# Patient Record
Sex: Male | Born: 1945
Health system: Southern US, Community
[De-identification: ages and names within clinical notes are randomized; demographics above are authoritative.]

## PROBLEM LIST (undated history)

## (undated) DIAGNOSIS — M1990 Unspecified osteoarthritis, unspecified site: Secondary | ICD-10-CM

## (undated) DIAGNOSIS — E7800 Pure hypercholesterolemia, unspecified: Secondary | ICD-10-CM

## (undated) DIAGNOSIS — E785 Hyperlipidemia, unspecified: Secondary | ICD-10-CM

## (undated) DIAGNOSIS — J309 Allergic rhinitis, unspecified: Secondary | ICD-10-CM

## (undated) DIAGNOSIS — Z8719 Personal history of other diseases of the digestive system: Secondary | ICD-10-CM

## (undated) DIAGNOSIS — M545 Low back pain, unspecified: Secondary | ICD-10-CM

## (undated) DIAGNOSIS — R972 Elevated prostate specific antigen [PSA]: Secondary | ICD-10-CM

## (undated) DIAGNOSIS — K219 Gastro-esophageal reflux disease without esophagitis: Secondary | ICD-10-CM

## (undated) DIAGNOSIS — G8929 Other chronic pain: Secondary | ICD-10-CM

## (undated) DIAGNOSIS — B029 Zoster without complications: Secondary | ICD-10-CM

## (undated) DIAGNOSIS — C4490 Unspecified malignant neoplasm of skin, unspecified: Secondary | ICD-10-CM

## (undated) DIAGNOSIS — K222 Esophageal obstruction: Secondary | ICD-10-CM

## (undated) DIAGNOSIS — R7301 Impaired fasting glucose: Secondary | ICD-10-CM

## (undated) DIAGNOSIS — I2510 Atherosclerotic heart disease of native coronary artery without angina pectoris: Secondary | ICD-10-CM

## (undated) DIAGNOSIS — G4730 Sleep apnea, unspecified: Secondary | ICD-10-CM

## (undated) DIAGNOSIS — K644 Residual hemorrhoidal skin tags: Secondary | ICD-10-CM

## (undated) DIAGNOSIS — I10 Essential (primary) hypertension: Secondary | ICD-10-CM

## (undated) HISTORY — DX: Atherosclerotic heart disease of native coronary artery without angina pectoris: I25.10

## (undated) HISTORY — DX: Low back pain: M54.5

## (undated) HISTORY — DX: Personal history of other diseases of the digestive system: Z87.19

## (undated) HISTORY — DX: Impaired fasting glucose: R73.01

## (undated) HISTORY — PX: OTHER SURGICAL HISTORY: SHX169

## (undated) HISTORY — DX: Residual hemorrhoidal skin tags: K64.4

## (undated) HISTORY — DX: Esophageal obstruction: K22.2

## (undated) HISTORY — PX: APPENDECTOMY: SHX54

## (undated) HISTORY — DX: Essential (primary) hypertension: I10

## (undated) HISTORY — DX: Hyperlipidemia, unspecified: E78.5

## (undated) HISTORY — DX: Low back pain, unspecified: M54.50

## (undated) HISTORY — DX: Low back pain, unspecified: G89.29

## (undated) HISTORY — DX: Zoster without complications: B02.9

## (undated) HISTORY — DX: Allergic rhinitis, unspecified: J30.9

## (undated) HISTORY — DX: Gastro-esophageal reflux disease without esophagitis: K21.9

## (undated) HISTORY — PX: TONSILLECTOMY: SUR1361

---

## 2007-01-24 ENCOUNTER — Emergency Department (HOSPITAL_COMMUNITY): Admission: EM | Admit: 2007-01-24 | Discharge: 2007-01-24 | Payer: Self-pay | Admitting: Emergency Medicine

## 2008-03-27 HISTORY — PX: CORONARY ANGIOPLASTY WITH STENT PLACEMENT: SHX49

## 2008-10-07 ENCOUNTER — Telehealth (INDEPENDENT_AMBULATORY_CARE_PROVIDER_SITE_OTHER): Payer: Self-pay

## 2008-10-08 ENCOUNTER — Ambulatory Visit: Payer: Self-pay | Admitting: Cardiology

## 2008-10-08 ENCOUNTER — Ambulatory Visit: Payer: Self-pay

## 2008-10-08 DIAGNOSIS — E785 Hyperlipidemia, unspecified: Secondary | ICD-10-CM | POA: Insufficient documentation

## 2008-10-08 DIAGNOSIS — R9439 Abnormal result of other cardiovascular function study: Secondary | ICD-10-CM

## 2008-10-08 DIAGNOSIS — I10 Essential (primary) hypertension: Secondary | ICD-10-CM | POA: Insufficient documentation

## 2008-10-08 DIAGNOSIS — K644 Residual hemorrhoidal skin tags: Secondary | ICD-10-CM | POA: Insufficient documentation

## 2008-10-09 ENCOUNTER — Ambulatory Visit: Payer: Self-pay | Admitting: Cardiovascular Disease

## 2008-10-09 ENCOUNTER — Inpatient Hospital Stay (HOSPITAL_COMMUNITY): Admission: RE | Admit: 2008-10-09 | Discharge: 2008-10-10 | Payer: Self-pay | Admitting: Cardiovascular Disease

## 2008-10-12 ENCOUNTER — Encounter: Payer: Self-pay | Admitting: Cardiovascular Disease

## 2008-10-28 DIAGNOSIS — K219 Gastro-esophageal reflux disease without esophagitis: Secondary | ICD-10-CM | POA: Insufficient documentation

## 2008-10-28 DIAGNOSIS — K222 Esophageal obstruction: Secondary | ICD-10-CM

## 2008-11-09 ENCOUNTER — Ambulatory Visit: Payer: Self-pay | Admitting: Cardiovascular Disease

## 2008-11-15 DIAGNOSIS — I2510 Atherosclerotic heart disease of native coronary artery without angina pectoris: Secondary | ICD-10-CM | POA: Insufficient documentation

## 2008-12-04 ENCOUNTER — Ambulatory Visit: Payer: Self-pay | Admitting: Cardiovascular Disease

## 2008-12-08 ENCOUNTER — Telehealth: Payer: Self-pay | Admitting: Cardiovascular Disease

## 2008-12-09 LAB — CONVERTED CEMR LAB
ALT: 19 units/L (ref 0–53)
AST: 24 units/L (ref 0–37)
Alkaline Phosphatase: 83 units/L (ref 39–117)
Bilirubin, Direct: 0.2 mg/dL (ref 0.0–0.3)
HDL: 73.2 mg/dL (ref >39.00)
Total Bilirubin: 1.8 mg/dL — ABNORMAL HIGH (ref 0.3–1.2)
Total Protein: 7.2 g/dL (ref 6.0–8.3)

## 2009-04-27 ENCOUNTER — Ambulatory Visit: Payer: Self-pay | Admitting: Cardiovascular Disease

## 2009-09-20 ENCOUNTER — Encounter: Payer: Self-pay | Admitting: Cardiovascular Disease

## 2009-09-28 ENCOUNTER — Telehealth (INDEPENDENT_AMBULATORY_CARE_PROVIDER_SITE_OTHER): Payer: Self-pay | Admitting: *Deleted

## 2009-09-29 ENCOUNTER — Encounter: Payer: Self-pay | Admitting: Cardiology

## 2009-09-29 ENCOUNTER — Ambulatory Visit: Payer: Self-pay

## 2009-09-29 ENCOUNTER — Ambulatory Visit: Payer: Self-pay | Admitting: Cardiology

## 2009-09-29 ENCOUNTER — Encounter (HOSPITAL_COMMUNITY): Admission: RE | Admit: 2009-09-29 | Discharge: 2009-12-01 | Payer: Self-pay | Admitting: Internal Medicine

## 2009-10-18 ENCOUNTER — Ambulatory Visit: Payer: Self-pay | Admitting: Cardiovascular Disease

## 2009-10-22 ENCOUNTER — Telehealth: Payer: Self-pay | Admitting: Cardiovascular Disease

## 2009-10-25 ENCOUNTER — Telehealth: Payer: Self-pay | Admitting: Cardiovascular Disease

## 2009-11-26 ENCOUNTER — Telehealth: Payer: Self-pay | Admitting: Cardiology

## 2010-01-19 ENCOUNTER — Ambulatory Visit: Payer: Self-pay | Admitting: Cardiovascular Disease

## 2010-01-21 LAB — CONVERTED CEMR LAB
ALT: 19 units/L (ref 0–53)
Albumin: 4 g/dL (ref 3.5–5.2)
Bilirubin, Direct: 0.2 mg/dL (ref 0.0–0.3)
HDL: 67.2 mg/dL (ref >39.00)
Total Bilirubin: 1.5 mg/dL — ABNORMAL HIGH (ref 0.3–1.2)
Total CHOL/HDL Ratio: 3
Total Protein: 6.7 g/dL (ref 6.0–8.3)

## 2010-03-24 ENCOUNTER — Encounter (INDEPENDENT_AMBULATORY_CARE_PROVIDER_SITE_OTHER): Payer: Self-pay | Admitting: *Deleted

## 2010-04-26 NOTE — Progress Notes (Signed)
Summary: Change to Crestor  Phone Note Outgoing Call   Call placed by: Julieta Gutting, RN, BSN,  October 25, 2009 6:46 PM Call placed to: Patient Summary of Call: I spoke with the pt and made him aware that Zetia was not approved by his insurance.  Dr Excell Seltzer recommended either increasing Lipitor to 80mg  daily or stopping Lipitor and switching to Crestor 40mg .  The pt would like to switch to Crestor 40mg  daily.  The pt plans on finishing his current supply of Lipitor and will call our office when he starts Crestor so that his 3 month labwork can be scheduled.  Initial call taken by: Julieta Gutting, RN, BSN,  October 25, 2009 6:46 PM    New/Updated Medications: CRESTOR 40 MG TABS (ROSUVASTATIN CALCIUM) Take one tablet by mouth daily. Prescriptions: CRESTOR 40 MG TABS (ROSUVASTATIN CALCIUM) Take one tablet by mouth daily.  #30 x 4   Entered by:   Julieta Gutting, RN, BSN   Authorized by:   Norva Karvonen, MD   Signed by:   Julieta Gutting, RN, BSN on 10/25/2009   Method used:   Electronically to        [facility] [phone][facility] (retail)       [address]       [place], [place]  [address]       Ph: [phone] or [phone]       Fax: [phone]   RxID:   [id number]

## 2010-04-26 NOTE — Assessment & Plan Note (Signed)
Summary: 6 MONTH   Visit Type:  6 months follow up Primary Provider:  Eric Form, MD  CC:  No complaints.  History of Present Illness: This is a 65 year-old male with CAD s/p stentking of the right coronary artery last year, presenting today for follow-up evaluation. He had atypical symptoms at presentation, with left shoulder pain only at rest. A Myoview scan was ordered and this showed a large area of ischemia. Subsequent cath October 09, 2008 showed critical RCA stenosis treated successfully with a drug-eluting stent.  The pt is doing well at present. He exercises regularly and has no exeritonal symptoms. He denies further left shoulder pain since PCI. He denies chest pain, dyspnea, orthopnea, PND, edema, palpitations, lightheadedness, or syncope.   Current Medications (verified): 1)  Cozaar 100 Mg Tabs (Losartan Potassium) .... One By Mouth Daily 2)  Norvasc 10 Mg Tabs (Amlodipine Besylate) .... Take 1 Tablet By Mouth Once A Day 3)  Lipitor 40 Mg Tabs (Atorvastatin Calcium) .... Take One Tablet By Mouth Daily. 4)  Flonase 50 Mcg/act Susp (Fluticasone Propionate) .... Daily 5)  Protonix 40 Mg Tbec (Pantoprazole Sodium) .... 2 X A Week 6)  Zyrtec Allergy 10 Mg Tabs (Cetirizine Hcl) .... Daily 7)  Aspirin Ec 325 Mg Tbec (Aspirin) .... Take One Tablet By Mouth Daily 8)  Magnesium 250 Mg Tabs (Magnesium) .... One By Mouth Once Daily 9)  Calcium Plus Vitamin D 600-100 Mg-Unit Caps (Calcium Carbonate-Vitamin D) .... One By Mouth Once Daily 10)  Ginkoba 40 Mg Tabs (Ginkgo Biloba) .Marland Kitchen.. 1 By Mouth Once Daily 11)  Zinc Sulfate 100 Mg .Marland Kitchen.. 1 By Mouth Once Daily 12)  Ambien 10 Mg Tabs (Zolpidem Tartrate) .... Prn 13)  Plavix 75 Mg Tabs (Clopidogrel Bisulfate) .... Take 2 Tablets Once A Day 14)  Nitroglycerin 0.4 Mg Subl (Nitroglycerin) .... One Tablet Under Tongue Every 5 Minutes As Needed For Chest Pain---May Repeat Times Three 15)  Fish Oil 1200 Mg Caps (Omega-3 Fatty Acids) .... Take 1 Capsule By  Mouth Two Times A Day  Allergies: 1)  ! Codeine 2)  ! * Bee Stings  Past History:  Past medical history reviewed for relevance to current acute and chronic problems.  Past Medical History: Reviewed history from 10/28/2008 and no changes required. Current Problems:  CAD (ICD-414.00)- post 3.5 x 15 mm XIENCE drug-eluting stent to the distal right coronary artery.  MYOCARDIAL PERFUSION SCAN, WITH STRESS TEST, ABNORMAL (ICD-794.39) ESSENTIAL HYPERTENSION, BENIGN (ICD-401.1) HYPERLIPIDEMIA (ICD-272.4) GERD (ICD-530.81) EXTERNAL HEMORRHOIDS (ICD-455.3) ESOPHAGEAL STRICTURE (ICD-530.3)- STATUS POST DILATATION 10-08-08  Review of Systems       Negative except as per HPI   Vital Signs:  Patient profile:   65 year old male Height:      72 inches Weight:      200.25 pounds BMI:     27.26 Pulse rate:   54 / minute Pulse rhythm:   regular Resp:     18 per minute BP sitting:   110 / 68  (left arm) Cuff size:   large  Vitals Entered By: Vikki Ports (April 27, 2009 1:45 PM)  Physical Exam  General:  Pt is alert and oriented, in no acute distress. HEENT: normal Neck: normal carotid upstrokes without bruits, JVP normal Lungs: CTA CV: RRR without murmur or gallop Abd: soft, NT, positive BS, no bruit, no organomegaly Ext: no clubbing, cyanosis, or edema. peripheral pulses 2+ and equal Skin: warm and dry without rash    EKG  Procedure  date:  04/27/2009  Findings:      NSR with sinus arrhythmia, otherwise within normal limits, HR 64 bpm.  Impression & Recommendations:  Problem # 1:  CAD, NATIVE VESSEL (ICD-414.01) The pt is stable without angina. Advised to reduce ASA dose to 81 mg to lower long-term bleeding risk since he is on plavix as well. Will do a stress Myoview when he is one year out form PCI (July) since he presented with atypical symptoms. If his stress test is ok, I will plan on yearly clinical follow-up without further stress testing unless symptoms  arise.  His updated medication list for this problem includes:    Norvasc 10 Mg Tabs (Amlodipine besylate) .Marland Kitchen... Take 1 tablet by mouth once a day    Aspirin 81 Mg Tbec (Aspirin) .Marland Kitchen... Take one tablet by mouth daily    Plavix 75 Mg Tabs (Clopidogrel bisulfate) .Marland Kitchen... Take 2 tablets once a day    Nitroglycerin 0.4 Mg Subl (Nitroglycerin) ..... One tablet under tongue every 5 minutes as needed for chest pain---may repeat times three  Problem # 2:  HYPERLIPIDEMIA (ICD-272.4) Pt to see Dr Clelia Croft for his annual physical exam in June. He will have follow up lipids checked with his labwork at [facility].  Last lipid panel was excellent - see below.  His updated medication list for this problem includes:    Lipitor 40 Mg Tabs (Atorvastatin calcium) .Marland Kitchen... Take one tablet by mouth daily.  CHOL: 162 (12/04/2008)   LDL: 79 (12/04/2008)   HDL: 73.20 (12/04/2008)   TG: 50.0 (12/04/2008)  Problem # 3:  ESSENTIAL HYPERTENSION, BENIGN (ICD-401.1) Well - controlled. His updated medication list for this problem includes:    Cozaar 100 Mg Tabs (Losartan potassium) ..... One by mouth daily    Norvasc 10 Mg Tabs (Amlodipine besylate) .Marland Kitchen... Take 1 tablet by mouth once a day    Aspirin 81 Mg Tbec (Aspirin) .Marland Kitchen... Take one tablet by mouth daily  BP today: 110/68 Prior BP: 118/72 (11/09/2008)  Labs Reviewed: Chol: 162 (12/04/2008)   HDL: 73.20 (12/04/2008)   LDL: 79 (12/04/2008)   TG: 50.0 (12/04/2008)  Patient Instructions: 1)  Your physician recommends that you schedule a follow-up appointment in: 6 MONTHS 2)  Your physician has recommended you make the following change in your medication: DECREASE ASPIRIN 81MG  ONCE DAILY 3)  Your physician has requested that you have an exercise stress myoview.  For further information please visit [email or web address]  Please follow instruction sheet, as given. 4)  SCHEDULE IN JULY PRIOR TO OFFICE VISIT

## 2010-04-26 NOTE — Progress Notes (Signed)
Summary: change in refill  Phone Note Call from Patient   Reason for Call: Talk to Nurse Summary of Call: pt  having trouble getting insurance to cover med because they have him as wc Crigger and we called it in as Nicholas Hernandez-can we callrefill back in at [facility] [phone] Initial call taken by: Glynda Jaeger,  October 22, 2009 2:39 PM  Follow-up for Phone Call        call pt. back about Rx is not denied because a name. Insurance prefers him to try simvastatin or crestor insterad. Pt. aware.  waiting for Dr. Earmon Phoenix decision. Nicholas Hernandez  October 22, 2009 3:21 PM   Additional Follow-up for Phone Call Additional follow up Details #1::        Recommend discontinue lipitor and trial of crestor 40 mg daily. f/u lipids and lft's 12 weeks with Dr Clelia Croft. Additional Follow-up by: Norva Karvonen, MD,  October 22, 2009 10:23 PM

## 2010-04-26 NOTE — Progress Notes (Signed)
Summary: c/o leg cramp  Phone Note Call from Patient Call back at Home Phone [phone]   Caller: Patient Reason for Call: Talk to Nurse Summary of Call: c/o leg cramps, pt on crestor 40 mg.  Initial call taken by: Lorne Skeens,  November 26, 2009 9:55 AM  Follow-up for Phone Call        Spoke with pt. Patient states he is having cramps on his LE after starting take Crestor 40 mg on 10/25/09. Pt. states the pain is getting worse. According to pt. and wife, Dr. Excell Seltzer recommeded for pt. first, Zetia 10 mg daily and continue taken Lipitor 40 mg daily. Pt's Insurance wanted for pt. to try something else first. Dr. Excell Seltzer stoped Lipitor and stated pt. on Crestor 40 mg daily.  Follow-up by: Ollen Gross, RN, BSN,  November 26, 2009 10:52 AM  Additional Follow-up for Phone Call Additional follow up Details #1::        Dr. Daleen Squibb DOD recommends for pt. to stop Crestor and start Lipitor 40 mg daily ant to have Lipid and liver blood work in 6 to 8 weeks. A prescription send to [facility] pharmacy as requested per pt's wife. Pt has an appointment for blood work on [facility] lab on 01/12/10. Pt and wife aware. Additional Follow-up by: Ollen Gross, RN, BSN,  November 26, 2009 11:16 AM    Additional Follow-up for Phone Call Additional follow up Details #2::     Reviewed Juanito Doom, MD  Follow-up by: Gaylord Shih, MD, [facility],  November 26, 2009 1:27 PM  New/Updated Medications: LIPITOR 40 MG TABS (ATORVASTATIN CALCIUM) take one tablet by mouth daily Prescriptions: LIPITOR 40 MG TABS (ATORVASTATIN CALCIUM) take one tablet by mouth daily  #30 x 6   Entered by:   Ollen Gross, RN, BSN   Authorized by:   Gaylord Shih, MD, [facility]   Signed by:   Ollen Gross, RN, BSN on 11/26/2009   Method used:   [facility]        [facility] [phone]* (retail)       [address]       [place], [place]  [address]       Ph: [phone] or [phone]       Fax: [phone]   RxID:   [id number]

## 2010-04-26 NOTE — Assessment & Plan Note (Signed)
Summary: V7Q   Visit Type:  6 months follow up Primary Provider:  Eric Form, MD  CC:  No complaints.  History of Present Illness: This is a 65 year-old male with CAD s/p stentking of the right coronary artery in 2010, presenting today for follow-up evaluation. He had atypical symptoms at presentation, with left shoulder pain only at rest. A Myoview scan was ordered and this showed a large area of ischemia. Subsequent cath October 09, 2008 showed critical RCA stenosis treated successfully with a drug-eluting stent.  The patient denies recurrent ischemic symptoms. He specifically denies chest pain, dyspnea, edema, lightheadedness, or palpitations.   Current Medications (verified): 1)  Cozaar 100 Mg Tabs (Losartan Potassium) .... One By Mouth Daily 2)  Norvasc 10 Mg Tabs (Amlodipine Besylate) .... Take 1 Tablet By Mouth Once A Day 3)  Lipitor 40 Mg Tabs (Atorvastatin Calcium) .... Take One Tablet By Mouth Daily. 4)  Flonase 50 Mcg/act Susp (Fluticasone Propionate) .... Daily 5)  Protonix 40 Mg Tbec (Pantoprazole Sodium) .... 2 X A Week 6)  Zyrtec Allergy 10 Mg Tabs (Cetirizine Hcl) .... Daily 7)  Aspirin 81 Mg Tbec (Aspirin) .... Take One Tablet By Mouth Daily 8)  Calcium Plus Vitamin D 600-100 Mg-Unit Caps (Calcium Carbonate-Vitamin D) .... One By Mouth Once Daily 9)  Ginkoba 40 Mg Tabs (Ginkgo Biloba) .Marland Kitchen.. 1 By Mouth Once Daily 10)  Zinc Sulfate 100 Mg .Marland Kitchen.. 1 By Mouth Once Daily 11)  Ambien 10 Mg Tabs (Zolpidem Tartrate) .... Prn 12)  Plavix 75 Mg Tabs (Clopidogrel Bisulfate) .... Take 2 Tablets Once A Day 13)  Nitroglycerin 0.4 Mg Subl (Nitroglycerin) .... One Tablet Under Tongue Every 5 Minutes As Needed For Chest Pain---May Repeat Times Three 14)  Fish Oil 1200 Mg Caps (Omega-3 Fatty Acids) .... Take 1 Capsule By Mouth Two Times A Day 15)  Boswellia Serrata Extract  Powd (Boswellia Serrata Extract) .... Two Times A Day  Allergies: 1)  ! Codeine 2)  ! * Bee Stings  Past  History:  Past medical history reviewed for relevance to current acute and chronic problems.  Past Medical History: Current Problems:  CAD (ICD-414.00)- post 3.5 x 15 mm XIENCE drug-eluting stent to the distal right coronary artery.  ESSENTIAL HYPERTENSION, BENIGN (ICD-401.1) HYPERLIPIDEMIA (ICD-272.4) GERD (ICD-530.81) EXTERNAL HEMORRHOIDS (ICD-455.3) ESOPHAGEAL STRICTURE (ICD-530.3)- STATUS POST DILATATION 10-08-08  Review of Systems       Negative except as per HPI   Vital Signs:  Patient profile:   65 year old male Height:      72 inches Weight:      197.50 pounds BMI:     26.88 Pulse rate:   60 / minute Pulse rhythm:   regular Resp:     18 per minute BP sitting:   120 / 76  (left arm) Cuff size:   large  Vitals Entered By: Vikki Ports (October 18, 2009 11:43 AM)  Physical Exam  General:  Pt is alert and oriented, in no acute distress. HEENT: normal Neck: normal carotid upstrokes without bruits, JVP normal Lungs: CTA CV: RRR without murmur or gallop Abd: soft, NT, positive BS, no bruit, no organomegaly Ext: no clubbing, cyanosis, or edema. peripheral pulses 2+ and equal Skin: warm and dry without rash    Nuclear ETT  Procedure date:  09/29/2009  Findings:      Overall Impression   Exercise Capacity: Excellent exercise capacity. BP Response: Hypertensive blood pressure response. Clinical Symptoms: Stopped due to fatigue, no chest pain.  ECG Impression: No significant ST segment change suggestive of ischemia. Overall Impression: Normal stress nuclear study.  Impression & Recommendations:  Problem # 1:  CAD, NATIVE VESSEL (ICD-414.01) Pt stable without angina. He had a nonischemic Myoview study earlier this month with excellent exercise capacity. Recommend continue dual antiplatelet Rx with ASA/Plavix.  His updated medication list for this problem includes:    Norvasc 10 Mg Tabs (Amlodipine besylate) .Marland Kitchen... Take 1 tablet by mouth once a day    Aspirin 81  Mg Tbec (Aspirin) .Marland Kitchen... Take one tablet by mouth daily    Plavix 75 Mg Tabs (Clopidogrel bisulfate) .Marland Kitchen... Take 2 tablets once a day    Nitroglycerin 0.4 Mg Subl (Nitroglycerin) ..... One tablet under tongue every 5 minutes as needed for chest pain---may repeat times three  Problem # 2:  HYPERLIPIDEMIA (ICD-272.4) Lipids reviewed from Dr Alver Fisher office - Chol 178, HDL 72, LDL 91, Trig 77. Will add Zetia to Lipitor to try to achieve LDL less than 70 mg/dL.  His updated medication list for this problem includes:    Lipitor 40 Mg Tabs (Atorvastatin calcium) .Marland Kitchen... Take one tablet by mouth daily.    Zetia 10 Mg Tabs (Ezetimibe) .Marland Kitchen... Take one tablet by mouth daily.  Problem # 3:  ESSENTIAL HYPERTENSION, BENIGN (ICD-401.1)  Controlled.  His updated medication list for this problem includes:    Cozaar 100 Mg Tabs (Losartan potassium) ..... One by mouth daily    Norvasc 10 Mg Tabs (Amlodipine besylate) .Marland Kitchen... Take 1 tablet by mouth once a day    Aspirin 81 Mg Tbec (Aspirin) .Marland Kitchen... Take one tablet by mouth daily  BP today: 120/76 Prior BP: 110/68 (04/27/2009)  Labs Reviewed: Chol: 162 (12/04/2008)   HDL: 73.20 (12/04/2008)   LDL: 79 (12/04/2008)   TG: 50.0 (12/04/2008)  Patient Instructions: 1)  Your physician wants you to follow-up in:  1 year with Dr. Excell Seltzer. You will receive a reminder letter in the mail two months in advance. If you don't receive a letter, please call our office to schedule the follow-up appointment. 2)  Your physician has requested that you have an exercise tolerance test in 1 year with Dr. Excell Seltzer.  For further information please visit [email or web address]  Please also follow instruction sheet, as given. 3)  Start Zetia 10mg  once daily. 4)  You will need a lipid & liver panel at your Primary Care Provider in 3 months.  Prescriptions: ZETIA 10 MG TABS (EZETIMIBE) Take one tablet by mouth daily.  #30 x 6   Entered by:   Sherri Rad, RN, BSN   Authorized by:   Norva Karvonen, MD   Signed by:   Sherri Rad, RN, BSN on 10/18/2009   Method used:   [facility] to        [facility] [phone]* (retail)       [address]       [place], [place]  [address]       Ph: [phone] or [phone]       Fax: [phone]   RxID:   [id number]

## 2010-04-26 NOTE — Assessment & Plan Note (Signed)
Summary: Cardiology Nuclear Study  Nuclear Med Background Indications for Stress Test: Evaluation for Ischemia, Stent Patency   History: COPD, GXT, Heart Catheterization, Myocardial Perfusion Study, Stents  History Comments:  ~5 yrs GXT:OK per patient; '10 ZOX:WRUEA area inferior ischemia, EF=63%>Cath: RCA 90%, N/O LAD/CFX, EF=55%>Stent-RCA  Symptoms: DOE  Symptoms Comments: No cardiac symptoms.   Nuclear Pre-Procedure Cardiac Risk Factors: Family History - CAD, History of Smoking, Hypertension, Lipids Caffeine/Decaff Intake: None NPO After: 9:00 PM Lungs: Clear IV 0.9% NS with Angio Cath: 20g     IV Site: (R) AC IV Started by: Irean Hong RN Chest Size (in) 44     Height (in): 72 Weight (lb): 193 BMI: 26.27  Nuclear Med Study 1 or 2 day study:  1 day     Stress Test Type:  Stress Reading MD:  Marca Ancona, MD     Referring MD:  Tonny Bollman, MD Resting Radionuclide:  Technetium 65m Tetrofosmin     Resting Radionuclide Dose:  11 mCi  Stress Radionuclide:  Technetium 87m Tetrofosmin     Stress Radionuclide Dose:  33 mCi   Stress Protocol Exercise Time (min):  12:01 min     Max HR:  134 bpm     Predicted Max HR:  158 bpm  Max Systolic BP: 231 mm Hg     Percent Max HR:  84.81 %     METS: 13.4 Rate Pressure Product:  54098    Stress Test Technologist:  Rea College CMA-N     Nuclear Technologist:  Domenic Polite CNMT  Rest Procedure  Myocardial perfusion imaging was performed at rest 45 minutes following the intravenous administration of Myoview Technetium 35m Tetrofosmin.  Stress Procedure  The patient exercised for 12:01.  The patient stopped due to fatigue and denied any chest pain.  There were no significant ST-T wave changes; only rare PJC and PAC.  He did have a hypertensive response to exercise with peak BP of 231/79.  Myoview was injected at peak exercise and myocardial perfusion imaging was performed after a brief delay.  QPS Raw Data Images:  Normal; no  motion artifact; normal heart/lung ratio. Stress Images:  NI: Uniform and normal uptake of tracer in all myocardial segments. Rest Images:  Normal homogeneous uptake in all areas of the myocardium. Subtraction (SDS):  There is no evidence of scar or ischemia. Transient Ischemic Dilatation:  .98  (Normal <1.22)  Lung/Heart Ratio:  .37  (Normal <0.45)  Quantitative Gated Spect Images QGS EDV:  105 ml QGS ESV:  40 ml QGS EF:  62 % QGS cine images:  Normal wall motion.    Overall Impression  Exercise Capacity: Excellent exercise capacity. BP Response: Hypertensive blood pressure response. Clinical Symptoms: Stopped due to fatigue, no chest pain.  ECG Impression: No significant ST segment change suggestive of ischemia. Overall Impression: Normal stress nuclear study.       Appended Document: Cardiology Nuclear Study good exercise tolerance and no ischemia noted.  Appended Document: Cardiology Nuclear Study Pt aware of results by phone.

## 2010-04-26 NOTE — Progress Notes (Signed)
Summary: Nuclear Pre-Procedure  Phone Note Outgoing Call Call back at [facility] Phone [phone]   Call placed by: Stanton Kidney, EMT-P,  September 28, 2009 11:15 AM Action Taken: Phone Call Completed Summary of Call: Left message with information on Myoview Information Sheet (see scanned document for details).     Nuclear Med Background Indications for Stress Test: Evaluation for Ischemia, Stent Patency   History: COPD, GXT, Heart Catheterization, Myocardial Perfusion Study, Stents  History Comments:  ~5 yrs GXT:ok per patient 7/10 MPS: EF=63%, large area inferior ischemia > Heart Cath: EF=55%, RCA 90%, N/O LAD/CFX > Stents:RCA  Symptoms: Chest Tightness  Symptoms Comments: No S&S   Nuclear Pre-Procedure Cardiac Risk Factors: Family History - CAD, History of Smoking, Hypertension, Lipids Height (in): 72  Nuclear Med Study Referring MD:  Buren Kos, MD

## 2010-04-28 NOTE — Miscellaneous (Signed)
Summary: update med  Clinical Lists Changes  Medications: Changed medication from PLAVIX 75 MG TABS (CLOPIDOGREL BISULFATE) take 2 tablets once a day to PLAVIX 75 MG TABS (CLOPIDOGREL BISULFATE) Take 1 tablet by mouth once a day

## 2010-07-03 LAB — CBC
HCT: 47 % (ref 39.0–52.0)
HCT: 49.6 % (ref 39.0–52.0)
Hemoglobin: 16.1 g/dL (ref 13.0–17.0)
Hemoglobin: 16.8 g/dL (ref 13.0–17.0)
MCHC: 33.9 g/dL (ref 30.0–36.0)
MCV: 93.8 fL (ref 78.0–100.0)
Platelets: 241 10*3/uL (ref 150–400)
RDW: 13.7 % (ref 11.5–15.5)
RDW: 13.7 % (ref 11.5–15.5)

## 2010-07-03 LAB — BASIC METABOLIC PANEL
Calcium: 9.3 mg/dL (ref 8.4–10.5)
Chloride: 104 mEq/L (ref 96–112)
Chloride: 106 mEq/L (ref 96–112)
GFR calc Af Amer: >60 mL/min (ref >60)
GFR calc Af Amer: >60 mL/min (ref >60)
GFR calc non Af Amer: >60 mL/min (ref >60)
GFR calc non Af Amer: >60 mL/min (ref >60)
Potassium: 4.1 mEq/L (ref 3.5–5.1)
Potassium: 4.6 mEq/L (ref 3.5–5.1)
Sodium: 139 mEq/L (ref 135–145)
Sodium: 141 mEq/L (ref 135–145)

## 2010-07-03 LAB — PROTIME-INR: INR: 1.1 (ref 0.00–1.49)

## 2010-07-03 LAB — APTT: aPTT: 30 seconds (ref 24–37)

## 2010-07-12 ENCOUNTER — Other Ambulatory Visit: Payer: Self-pay | Admitting: Cardiology

## 2010-08-09 NOTE — Cardiovascular Report (Signed)
Nicholas Hernandez, [place] NO.:  [id number]   MEDICAL RECORD NO.:  [id number]          PATIENT TYPE:  INP   LOCATION:  2504                         FACILITY:  [facility]   PHYSICIAN:  Veverly Fells. Excell Seltzer, MD  DATE OF BIRTH:  1945-10-29   DATE OF PROCEDURE:  10/09/2008  DATE OF DISCHARGE:                            CARDIAC CATHETERIZATION   PROCEDURES:  Left heart catheterization, selective coronary angiography,  left ventricular angiography, percutaneous transluminal coronary  angioplasty, stenting of the right coronary artery, and Perclose of the  right femoral artery.   INDICATIONS:  Nicholas Hernandez is a very nice 65 year old gentleman who  presented yesterday after an abnormal Myoview stress test in the office.  The patient has been experiencing intermittent left shoulder pain.  His  shoulder pain has been non-exertional in nature.  In the setting of  multiple risk factors, he was referred for a stress test and this showed  significant inferior ischemia.  A cardiac catheterization was arranged  to evaluate for obstructive coronary disease.   Risks and indications of the procedure were reviewed with the patient.  Informed consent was obtained.  The right groin was prepped, draped, and  anesthetized with 1% lidocaine using modified Seldinger technique.  A 5-  French sheath was placed in the right femoral artery.  Standard 5-French  Judkins catheters were used for coronary angiography and left  ventriculography.  All catheter exchanges were performed over a  guidewire.  The patient tolerated the diagnostic procedure well.  The  diagnostic catheterization demonstrated severe distal right coronary  artery stenosis with an eccentric 90% lesion in that vessel.  I  discussed the findings with the patient and elected to proceed with ad  hoc PCI.  The patient was given an additional 300 mg of Plavix.  The  sheath was upsized to a 6-French.  While we were changing equipment, the  patient  developed marked inferior ST elevation on the monitor.  Angiomax  was quickly started for anticoagulation.  A 6-French JR-4 guide catheter  was inserted.  The patient was given a sublingual nitroglycerin.  After  he received nitroglycerin, his ST elevation resolved.  Angiography  demonstrated a patent right coronary artery.  Once a therapeutic ACT was  achieved, a Cougar guidewire was passed into the distal posterolateral  branch beyond the area of stenosis.  The vessel was then predilated with  a 2.5 x 15-mm Voyager balloon, which was taken to 8 atmospheres.  Following balloon inflation, there was improvement in the degree of  stenosis.  There was TIMI III flow in the vessel.  The balloon was left  in place and an angiogram was taken to assess total length needed for  stent coverage.  A 3.5 x 15-mm XIENCE drug-eluting stent was chosen.  The stent was carefully positioned and then deployed at 12 atmospheres.  The stent appeared well expanded and there was TIMI III flow following  stenting.  The stented segment was then postdilated with a 3.75 x 12-mm  Voyager Leola balloon.  There was an excellent angiographic result with  TIMI III flow at the  completion of the procedure and as well as good  stent expansion.  The guidewire and catheter were removed.  The femoral  arteriotomy was closed with Perclose device.  There were no immediate  complications.   FINDINGS:  Aortic pressure 104/66 with mean of 84, left ventricular  pressure 100 over 11.   Left Mainstem:  The left main is patent.  Bifurcates into the LAD and  left circumflex.  There is a large intermediate branch as well.   LAD:  The LAD is patent to the apex.  There is mild nonobstructive  plaque in the proximal portion of the LAD.  The midportion has a 40-50%  stenosis just before the second diagonal branch.  The LAD has diffuse  plaque, but no areas of high-grade stenosis.   Left Circumflex:  Left circumflex has a 30% ostial lesion.   There is a  moderate size intermediate branch and also has an ostial lesion in the  range of 40-50%.  This appears nonobstructive as well.  There are no  other areas of significant stenosis throughout the left coronary artery.   Right Coronary Artery:  The right coronary artery is tortuous.  It is a  large, dominant vessel.  The proximal and midportions have  nonobstructive plaque.  There is a severe, 90% focal stenosis in the  distal RCA before the bifurcation of the PDA and posterior AV segment.  The posterior AV segment supplies a posterolateral branch without  significant disease.  There are no other areas of high-grade stenosis  present.   Left Ventriculography:  There is an area of basal inferior hypokinesis  with overall preserved LVEF, estimated at 55%.  There is no significant  mitral regurgitation.   ASSESSMENT:  1. Severe distal right coronary artery stenosis with successful      percutaneous intervention using a single drug-eluting stent.  2. Nonobstructive disease as described above in the left anterior      descending (coronary artery) and left circumflex.  3. Mild segmental left ventricular dysfunction with overall preserved      left ventricular ejection fraction.   PLAN:  The patient should remain on dual antiplatelet therapy with  aspirin and Plavix for a minimum of 12 months.  He will continue with  aggressive secondary risk reduction measures.      Veverly Fells. Excell Seltzer, MD  Electronically Signed     MDC/MEDQ  D:  10/09/2008  T:  10/10/2008  Job:  [id number]   cc:   Kari Baars, M.D.  [facility]  Rollene Rotunda, MD, [facility]

## 2010-08-09 NOTE — Discharge Summary (Signed)
NAMEJAKYLE, Nicholas Hernandez NO.:  [id number]   MEDICAL RECORD NO.:  [id number]          PATIENT TYPE:  INP   LOCATION:  2504                         FACILITY:  [facility]   PHYSICIAN:  Marca Ancona, MD      DATE OF BIRTH:  1945/09/20   DATE OF ADMISSION:  10/09/2008  DATE OF DISCHARGE:  10/10/2008                               DISCHARGE SUMMARY   PRIMARY CARDIOLOGIST:  Veverly Fells. Excell Seltzer, MD   PRIMARY CARE PHYSICIAN:  Kari Baars, MD   PROCEDURES PERFORMED DURING HOSPITALIZATION:  1. Cardiac catheterization completed by Dr. Tonny Bollman on October 09, 2008.      a.     Severe distal right coronary artery stenosis with successful       percutaneous intervention using single drug-eluting stent.  This       is a 3.5 x 15 mm XIENCE drug-eluting stent to the distal right       coronary artery, nonobstructive disease in the left anterior       descending artery and left circumflex, mild segmental left       ventricular dysfunction with overall preserved left ventricular       ejection fraction.   FINAL DISCHARGE DIAGNOSES:  1. Coronary artery disease.      a.     Status post abnormal stress Myoview with inferior ischemia.      b.     Status post cardiac catheterization revealing 90% stenosis       in the distal right coronary artery before the bifurcation of the       posterior descending artery and posterior atrioventricular       segment, left circumflex had a 30% ostial lesion.  There was       moderate-sized intermediate branch with an ostial lesion in the       range of 40-50%.  Left anterior descending was patent to the apex,       ejection fraction was 55%.      c.     Status post 3.5 x 15 mm XIENCE drug-eluting stent to the       distal right coronary artery.  2. Hypertension.  3. Hyperlipidemia.  4. Esophageal stricture, status post dilatation.  5. ADAPT-DES study participant.   HOSPITAL COURSE:  This is a 65 year old Caucasian male, who was seen  originally in the office by Dr. Rollene Rotunda after having had a normal  stress Myoview revealing significant inferior ischemia.  The patient was  having intermittent shoulder pain, nonexertional in nature and  therefore, stress test was completed.  After review of stress Myoview,  the patient was scheduled for cardiac catheterization, which was  completed by Dr. Tonny Bollman on October 09, 2008.  Please see copy of  Dr. Earmon Phoenix thorough cardiac catheterization note for more details.  The patient tolerated the procedure well without evidence of bleeding,  hematoma, or signs of infection.  The patient had no recurrence of chest  discomfort or shoulder pain.  The patient was placed on aspirin and  Plavix, and his  Lipitor was increased from 10 mg to 20 mg daily.   On the following morning, the patient was seen and examined by myself  and Dr. Shirlee Latch and found to be stable for discharge.  The patient will  return home and have follow up with Dr. Tonny Bollman in the office  between 2 and 4 weeks.  He has been given post cardiac catheterization  instructions along with new medication prescriptions to include Plavix,  Lipitor, and nitroglycerin.  Thorough discharge instructions were given  to the patient and questions were answered.   DISCHARGE LABORATORIES:  Sodium 139, potassium 4.1, chloride 104, CO2 of  29, BUN 14, creatinine 1.0, and glucose 107.  Hemoglobin 16.8,  hematocrit 49.6, white blood cells 9.3, and platelets 244.  Telemetry  revealing normal sinus rhythm.  EKG dated October 10, 2008, revealing  normal sinus rhythm rate of 60 beats per minute with an incomplete right  bundle-branch block, evidence of septal infarct is noted.   DISCHARGE MEDICATIONS:  1. Plavix 75 mg daily.  2. Enteric-coated aspirin 325 mg daily.  3. Cozaar 100 mg daily.  4. Norvasc 10 mg daily./  5. Zocor 40 mg daily.  6. ADAPT-DES study participant.  7. Prilosec 20 mg daily.  8. Flonase 50 mcg as needed.   9. Fish oil 1000 mg caps daily.  10.Magnesium 250 mg daily.  11.Tramadol 75 mg as needed.  12.Ambien 10 mg at bedtime as needed.  13.Nitroglycerin 0.4 mg sublingual p.r.n.   ALLERGIES:  CODEINE and BEESTINGS.   FOLLOWUP PLANS AND APPOINTMENT:  1. The patient has been given post cardiac catheterization      instructions with particular emphasis on the right groin site for      evidence of bleeding, hematoma, signs of infection along with      activities.  2. The patient has been advised on medications, had been prescriptions      given at discharge.  3. The patient has been advised that our office will call him to make      a followup appointment as this is a weekend.  4. The patient has been advised to follow up with primary care      physician, Dr. Buren Kos for continued medical management.   TIME SPENT WITH THE PATIENT TO INCLUDE PHYSICIAN TIME:  35 minutes.      Bettey Mare. Lyman Bishop, NP      Marca Ancona, MD  Electronically Signed    KML/MEDQ  D:  10/10/2008  T:  10/10/2008  Job:  [id number]   cc:   Kari Baars, M.D.

## 2010-10-03 ENCOUNTER — Other Ambulatory Visit: Payer: Self-pay | Admitting: Cardiovascular Disease

## 2010-10-14 ENCOUNTER — Encounter: Payer: Self-pay | Admitting: Cardiovascular Disease

## 2010-10-18 ENCOUNTER — Ambulatory Visit (INDEPENDENT_AMBULATORY_CARE_PROVIDER_SITE_OTHER): Payer: BC Managed Care – PPO | Admitting: Cardiovascular Disease

## 2010-10-18 ENCOUNTER — Ambulatory Visit: Payer: Self-pay | Admitting: Cardiovascular Disease

## 2010-10-18 DIAGNOSIS — I2510 Atherosclerotic heart disease of native coronary artery without angina pectoris: Secondary | ICD-10-CM

## 2010-10-18 NOTE — Progress Notes (Signed)
Exercise Treadmill Test  Pre-Exercise Testing Evaluation Rhythm: normal sinus  Rate: 69   PR:  .17 QRS:  .09  QT:  .40 QTc: .43     Test  Exercise Tolerance Test Ordering MD: Tonny Bollman, MD  Interpreting MD:  Tonny Bollman, MD  Unique Test No: 1  Treadmill:  1  Indication for ETT: CAD  Contraindication to ETT: No   Stress Modality: exercise - treadmill  Cardiac Imaging Performed: non   Protocol: standard Bruce - maximal  Max BP:  194/81  Max MPHR (bpm):  156 85% MPR (bpm):  132  MPHR obtained (bpm):  150 % MPHR obtained:  95  Reached 85% MPHR (min:sec):  9:30 Total Exercise Time (min-sec):  12:00  Workload in METS:  13.4 Borg Scale: 12  Reason ETT Terminated:  desired heart rate attained    ST Segment Analysis At Rest: normal ST segments - no evidence of significant ST depression With Exercise: no evidence of significant ST depression  Other Information Arrhythmia:  No Angina during ETT:  absent (0) Quality of ETT:  diagnostic  ETT Interpretation:  normal - no evidence of ischemia by ST analysis  Comments: Excellent exercise capacity. Isolated PVC's with exercise, but no significant arrhythmia. No angina or significant ST depression with exertion.  Recommendations: Continue with medical therapy and exercise program.

## 2010-11-09 ENCOUNTER — Encounter: Payer: Self-pay | Admitting: Cardiovascular Disease

## 2010-12-30 ENCOUNTER — Other Ambulatory Visit: Payer: Self-pay | Admitting: Cardiovascular Disease

## 2011-01-04 LAB — DIFFERENTIAL
Basophils Absolute: 0
Basophils Relative: 0
Eosinophils Absolute: 0.3
Eosinophils Relative: 3
Lymphocytes Relative: 19
Lymphs Abs: 1.8
Monocytes Relative: 7
Neutrophils Relative %: 71

## 2011-01-04 LAB — CBC
HCT: 45.9
Hemoglobin: 15.7
MCHC: 34.2
MCV: 92.3
WBC: 9.8

## 2011-01-04 LAB — I-STAT 8, (EC8 V) (CONVERTED LAB)
Chloride: 106
Potassium: 4.7
Sodium: 138
TCO2: 29
pH, Ven: 7.332 — ABNORMAL HIGH

## 2011-01-04 LAB — POCT CARDIAC MARKERS
Myoglobin, poc: 96.4
Operator id: 146091
Operator id: 198171

## 2011-01-04 LAB — POCT I-STAT CREATININE
Creatinine, Ser: 1.2
Operator id: 146091

## 2011-03-22 ENCOUNTER — Other Ambulatory Visit: Payer: Self-pay | Admitting: Adult Health

## 2011-03-22 ENCOUNTER — Other Ambulatory Visit: Payer: Self-pay | Admitting: Cardiovascular Disease

## 2011-03-22 MED ORDER — NITROGLYCERIN 0.4 MG SL SUBL: 0.4000 mg | SUBLINGUAL_TABLET | SUBLINGUAL | Status: DC | PRN

## 2011-04-28 ENCOUNTER — Other Ambulatory Visit: Payer: Self-pay | Admitting: Cardiovascular Disease

## 2011-06-23 ENCOUNTER — Other Ambulatory Visit: Payer: Self-pay | Admitting: Cardiovascular Disease

## 2011-09-25 ENCOUNTER — Other Ambulatory Visit: Payer: Self-pay | Admitting: Cardiovascular Disease

## 2011-11-09 ENCOUNTER — Ambulatory Visit: Payer: BC Managed Care – PPO | Admitting: Cardiovascular Disease

## 2011-11-20 ENCOUNTER — Encounter: Payer: Self-pay | Admitting: Cardiovascular Disease

## 2011-11-20 ENCOUNTER — Ambulatory Visit (INDEPENDENT_AMBULATORY_CARE_PROVIDER_SITE_OTHER): Payer: BC Managed Care – PPO | Admitting: Cardiovascular Disease

## 2011-11-20 VITALS — BP 130/80 | HR 64 | Ht 72.0 in | Wt 200.0 lb

## 2011-11-20 DIAGNOSIS — E785 Hyperlipidemia, unspecified: Secondary | ICD-10-CM

## 2011-11-20 DIAGNOSIS — I2510 Atherosclerotic heart disease of native coronary artery without angina pectoris: Secondary | ICD-10-CM

## 2011-11-20 DIAGNOSIS — I10 Essential (primary) hypertension: Secondary | ICD-10-CM

## 2011-11-20 NOTE — Assessment & Plan Note (Signed)
Dr. Clelia Croft is recommended that he consider a trial of Lipitor and ezetimibe in combination to achieve a goal LDL closer to 70. I agree and I encouraged the patient to give this a trial.

## 2011-11-20 NOTE — Progress Notes (Signed)
HPI:  66 year old gentleman presenting for followup of coronary artery disease. He underwent PCI to right coronary artery in 2010 with a drug-eluting stent platform. He presented with somewhat atypical symptoms but had a markedly abnormal nuclear study with inferior ischemia. He presents today for followup evaluation. He was last seen in January 2012. His last nuclear scan July 2011 showed no ischemia. His quantitative left ventricular ejection fraction was 62%. He brings in lab work today from [facility]. His blood work was drawn 10/31/2011. This shows a glucose of 130, creatinine 1.0, cholesterol 193, triglycerides 78, HDL 78, and LDL 99.  The patient feels well. He is physically active. He walks with his wife every morning. He plays golf and carries his own bag. He has no symptoms of chest pain or pressure. He's had no shoulder pain which was his prior anginal equivalent. He denies orthopnea, PND, palpitations, lightheadedness, or syncope. He does admit to dyspnea with exertion when he really pushes it hard.  Outpatient Encounter Prescriptions as of 11/20/2011  Medication Sig Dispense Refill  . amLODipine (NORVASC) 10 MG tablet Take 10 mg by mouth daily.        Marland Kitchen aspirin 81 MG tablet Take 81 mg by mouth daily.        Jeanie Cooks Serrata Extract POWD by Does not apply route 2 (two) times daily.        . cetirizine (ZYRTEC ALLERGY) 10 MG tablet Take 10 mg by mouth daily.        . Ginkgo Biloba (GINKOBA) 40 MG TABS Take by mouth daily.        Marland Kitchen LIPITOR 40 MG tablet TAKE ONE TABLET BY MOUTH DAILY.  30 tablet  2  . losartan (COZAAR) 100 MG tablet Take 100 mg by mouth daily.        . nitroGLYCERIN (NITROSTAT) 0.4 MG SL tablet Place 1 tablet (0.4 mg total) under the tongue every 5 (five) minutes as needed.  100 tablet  6  . Omega-3 Fatty Acids (FISH OIL) 1200 MG CAPS Take by mouth 2 (two) times daily.        . pantoprazole (PROTONIX) 40 MG tablet TAKE 1 TABLET BY MOUTH ONCE A DAY  30  tablet  5  . PLAVIX 75 MG tablet TAKE 1 TABLET EVERY DAY  90 tablet  5  . ZINC SULFATE PO Take 100 mg by mouth daily.        Marland Kitchen zolpidem (AMBIEN) 10 MG tablet Take 10 mg by mouth as needed.        . fluticasone (FLONASE) 50 MCG/ACT nasal spray HOLD RIGHT NOW      . DISCONTD: Calcium Carb-Cholecalciferol (CALCIUM PLUS VITAMIN D) 600-100 MG-UNIT CAPS Take by mouth daily.          Allergies  Allergen Reactions  . Codeine     Past Medical History  Diagnosis Date  . CAD (coronary artery disease)     post 3.5 x 15mm xience durg-eluting stent to distal right coronary artery  . Essential hypertension, benign   . HLD (hyperlipidemia)   . GERD (gastroesophageal reflux disease)   . External hemorrhoids   . Esophageal stricture     status post dilatation 10-08-08    ROS: Negative except as per HPI  BP 130/80  Pulse 64  Ht 6' (1.829 m)  Wt 200 lb (90.719 kg)  BMI 27.12 kg/m2  PHYSICAL EXAM: Pt is alert and oriented, NAD HEENT: normal Neck: JVP - normal, carotids 2+=  without bruits Lungs: CTA bilaterally CV: RRR without murmur or gallop Abd: soft, NT, Positive BS, no hepatomegaly Ext: no C/C/E, distal pulses intact and equal Skin: warm/dry no rash  EKG:  Sinus bradycardia 56 beats per minute, within normal limits.  ASSESSMENT AND PLAN:

## 2011-11-20 NOTE — Assessment & Plan Note (Signed)
Blood pressure is well-controlled on losartan.  

## 2011-11-20 NOTE — Assessment & Plan Note (Signed)
Stable without anginal symptoms. He had atypical angina at initial presentation. He is 3 years out from PCI and have recommended an exercise treadmill study without imaging. If his treadmill shows no ischemia, I would recommend stopping Plavix at this point. He should continue on antiplatelet therapy with aspirin for the long term.

## 2011-11-20 NOTE — Patient Instructions (Addendum)
Your physician has requested that you have an exercise tolerance test. For further information please visit [email or web address] Please also follow instruction sheet, as given. IF YOUR TREADMILL TEST IS NORMAL YOU CAN STOP PLAVIX PER DR COOPER.   Your physician recommends that you continue on your current medications as directed. Please refer to the Current Medication list given to you today.  Your physician wants you to follow-up in: 1 YEAR with Dr Excell Seltzer.  You will receive a reminder letter in the mail two months in advance. If you don't receive a letter, please call our office to schedule the follow-up appointment.

## 2011-12-20 ENCOUNTER — Ambulatory Visit (INDEPENDENT_AMBULATORY_CARE_PROVIDER_SITE_OTHER): Payer: BC Managed Care – PPO | Admitting: Physician Assistant

## 2011-12-20 DIAGNOSIS — I25810 Atherosclerosis of coronary artery bypass graft(s) without angina pectoris: Secondary | ICD-10-CM

## 2011-12-20 DIAGNOSIS — I10 Essential (primary) hypertension: Secondary | ICD-10-CM

## 2011-12-20 DIAGNOSIS — I2510 Atherosclerotic heart disease of native coronary artery without angina pectoris: Secondary | ICD-10-CM

## 2011-12-20 DIAGNOSIS — E785 Hyperlipidemia, unspecified: Secondary | ICD-10-CM

## 2011-12-20 NOTE — Procedures (Signed)
Exercise Treadmill Test  Pre-Exercise Testing Evaluation Rhythm: normal sinus  Rate: 69   PR:  .16 QRS:  .09  QT:  .42 QTc: .45     Test  Exercise Tolerance Test Ordering MD: Tonny Bollman, MD  Interpreting MD: Tereso Newcomer, PA-C  Unique Test No: 1  Treadmill:  1  Indication for ETT: known ASHD  Contraindication to ETT: No   Stress Modality: exercise - treadmill  Cardiac Imaging Performed: non   Protocol: standard Bruce - maximal  Max BP:  207/87  Max MPHR (bpm):  155 85% MPR (bpm):  132  MPHR obtained (bpm):  151 % MPHR obtained:  97%  Reached 85% MPHR (min:sec):  9:15 Total Exercise Time (min-sec):  12:00  Workload in METS:  13.4 Borg Scale: 13.4  Reason ETT Terminated:  desired heart rate attained    ST Segment Analysis At Rest: normal ST segments - no evidence of significant ST depression With Exercise: no evidence of significant ST depression  Other Information Arrhythmia:  No Angina during ETT:  absent (0) Quality of ETT:  diagnostic  ETT Interpretation:  normal - no evidence of ischemia by ST analysis  Comments: Excellent exercise tolerance. No chest pain. Normal BP response to exercise. No ST-T changes to suggest ischemia.   Recommendations: Results d/w Kaylyn Lim. Follow up with Dr. Tonny Bollman as directed. Tereso Newcomer, PA-C  9:35 AM 12/20/2011

## 2012-06-30 ENCOUNTER — Other Ambulatory Visit: Payer: Self-pay | Admitting: Cardiovascular Disease

## 2012-11-04 ENCOUNTER — Encounter: Payer: Self-pay | Admitting: Cardiovascular Disease

## 2012-11-19 ENCOUNTER — Ambulatory Visit: Payer: BC Managed Care – PPO | Admitting: Cardiovascular Disease

## 2012-12-04 ENCOUNTER — Ambulatory Visit (INDEPENDENT_AMBULATORY_CARE_PROVIDER_SITE_OTHER): Payer: BC Managed Care – PPO | Admitting: Cardiovascular Disease

## 2012-12-04 ENCOUNTER — Encounter: Payer: Self-pay | Admitting: Cardiovascular Disease

## 2012-12-04 VITALS — BP 120/76 | HR 76 | Ht 72.0 in | Wt 197.0 lb

## 2012-12-04 DIAGNOSIS — I2510 Atherosclerotic heart disease of native coronary artery without angina pectoris: Secondary | ICD-10-CM

## 2012-12-04 NOTE — Patient Instructions (Addendum)
Your physician has requested that you have an exercise tolerance test in 1 year .  Please also follow instruction sheet, as given. No return office visit needed Dr Excell Seltzer will talk with you during treadmill.    Your physician recommends that you continue on your current medications as directed. Please refer to the Current Medication list given to you today.

## 2012-12-04 NOTE — Progress Notes (Signed)
HPI:   67 year old gentleman presenting for followup evaluation. He has coronary artery disease and underwent PCI of the right coronary artery in 2010. He has done well without recurrent symptoms since that time. He had a non-imaging exercise treadmill study one year ago where he was able to exercise for 12 minutes according to the Bruce protocol without significant ST changes or anginal symptoms.   The patient is doing very well. He is physically active and does a lot of walking and golfing. He has no symptoms with exertion. He specifically denies chest pain or pressure, dyspnea, or palpitations. He's compliant with his medical program. He tells me his lipids are much better this year after adding ezetimibe to the Lipitor.  Outpatient Encounter Prescriptions as of 12/04/2012  Medication Sig Dispense Refill  . amLODipine (NORVASC) 10 MG tablet Take 10 mg by mouth daily.        Marland Kitchen aspirin 81 MG tablet Take 81 mg by mouth daily.        . cetirizine (ZYRTEC ALLERGY) 10 MG tablet Take 10 mg by mouth daily.        Marland Kitchen ezetimibe (ZETIA) 10 MG tablet Take 10 mg by mouth daily.      . fluticasone (FLONASE) 50 MCG/ACT nasal spray HOLD RIGHT NOW      . Ginkgo Biloba (GINKOBA) 40 MG TABS Take by mouth daily.        Marland Kitchen LIPITOR 40 MG tablet TAKE ONE TABLET BY MOUTH DAILY.  30 tablet  2  . losartan (COZAAR) 100 MG tablet Take 100 mg by mouth daily.        . nitroGLYCERIN (NITROSTAT) 0.4 MG SL tablet Place 1 tablet (0.4 mg total) under the tongue every 5 (five) minutes as needed.  100 tablet  6  . Omega-3 Fatty Acids (FISH OIL) 1200 MG CAPS Take by mouth 2 (two) times daily.        . pantoprazole (PROTONIX) 40 MG tablet       . Psyllium (METAMUCIL PO) Take by mouth daily.      Marland Kitchen ZINC SULFATE PO Take 100 mg by mouth daily.        Marland Kitchen zolpidem (AMBIEN) 10 MG tablet Take 10 mg by mouth as needed.        . [DISCONTINUED] pantoprazole (PROTONIX) 40 MG tablet TAKE 1 TABLET BY MOUTH ONCE A DAY  30 tablet  5  .  [DISCONTINUED] Boswellia Serrata Extract POWD by Does not apply route 2 (two) times daily.        . [DISCONTINUED] PLAVIX 75 MG tablet TAKE 1 TABLET EVERY DAY  90 tablet  5   No facility-administered encounter medications on file as of 12/04/2012.    Allergies  Allergen Reactions  . Bee Venom   . Codeine     Past Medical History  Diagnosis Date  . CAD (coronary artery disease)     post 3.5 x 15mm xience durg-eluting stent to distal right coronary artery  . Essential hypertension, benign   . HLD (hyperlipidemia)   . GERD (gastroesophageal reflux disease)   . External hemorrhoids   . Esophageal stricture     status post dilatation 10-08-08    ROS: Negative except as per HPI  BP 120/76  Pulse 76  Ht 6' (1.829 m)  Wt 197 lb (89.359 kg)  BMI 26.71 kg/m2  SpO2 98%  PHYSICAL EXAM: Pt is alert and oriented, NAD HEENT: normal Neck: JVP - normal, carotids 2+= without bruits Lungs: CTA  bilaterally CV: RRR without murmur or gallop Abd: soft, NT, Positive BS, no hepatomegaly Ext: no C/C/E, distal pulses intact and equal Skin: warm/dry no rash  EKG:  Normal sinus rhythm 76 beats per minute, within normal limits.  ASSESSMENT AND PLAN: 1. Coronary artery disease, native vessel. The patient is stable without anginal symptoms. He will return in 12 months for followup. At that time I am going to have him do a non-imaging treadmill study.  His medications were reviewed and he is on a good medical program.  2. Hyperlipidemia. Followed by Dr. Clelia Croft. On aggressive lipid-lowering with ezetimibe, atorvastatin, and fish oil.  3. Hypertension. Blood pressure is well controlled on combination of amlodipine and losartan.  Tonny Bollman 12/04/2012        2:17 PM

## 2013-01-30 ENCOUNTER — Other Ambulatory Visit: Payer: Self-pay

## 2013-03-10 ENCOUNTER — Other Ambulatory Visit: Payer: Self-pay | Admitting: Cardiovascular Disease

## 2013-12-17 ENCOUNTER — Encounter: Payer: BC Managed Care – PPO | Admitting: Physician Assistant

## 2014-12-22 DIAGNOSIS — G8929 Other chronic pain: Secondary | ICD-10-CM | POA: Insufficient documentation

## 2014-12-22 DIAGNOSIS — B029 Zoster without complications: Secondary | ICD-10-CM | POA: Insufficient documentation

## 2014-12-22 DIAGNOSIS — J309 Allergic rhinitis, unspecified: Secondary | ICD-10-CM | POA: Insufficient documentation

## 2014-12-22 DIAGNOSIS — R7301 Impaired fasting glucose: Secondary | ICD-10-CM | POA: Insufficient documentation

## 2014-12-22 DIAGNOSIS — Z8719 Personal history of other diseases of the digestive system: Secondary | ICD-10-CM | POA: Insufficient documentation

## 2014-12-22 DIAGNOSIS — M545 Low back pain, unspecified: Secondary | ICD-10-CM | POA: Insufficient documentation

## 2014-12-23 NOTE — Progress Notes (Signed)
Cardiology Office Note   Date:  12/24/2014   ID:  Nicholas Hernandez, Nicholas Hernandez [birth year], MRN [id number]  PCP:  Marton Redwood, MD  Cardiologist:  Dr. Sherren Mocha   Electrophysiologist:  n/a  Chief Complaint  Patient presents with  . Coronary Artery Disease     History of Present Illness: Nicholas Hernandez is a 69 y.o. male with a hx of CAD status post PCI with a DES to the RCA in 2010. ETT 9/13 negative for ischemia. Last seen by Dr. Burt Knack 9/14. Plan was for follow-up ETT in 1 year.  Here for FU.  Doing very well. Plays golf (walks), pickle ball and does yard work. The patient denies chest pain, shortness of breath, syncope, orthopnea, PND or significant pedal edema.    Studies/Reports Reviewed Today:  ETT 9/13 ETT Interpretation: normal - no evidence of ischemia by ST analysis  Myoview 7/11 Overall Impression: Normal stress nuclear study.  EF 62%  LHC 7/10 LM:  Patent LAD:  midportion has a 40-50% LCx:  30% ostial, RI ostial 40-50% RCA:  Dist 90% LVEF, estimated at 55%.  PCI:  3.5 x 15-mm XIENCE drug-eluting stent to RCA    Past Medical History  Diagnosis Date  . CAD (coronary artery disease)     post 3.5 x 52mm xience durg-eluting stent to distal right coronary artery  . Essential hypertension, benign   . HLD (hyperlipidemia)   . GERD (gastroesophageal reflux disease)   . External hemorrhoids   . Esophageal stricture     status post dilatation 10-08-08  . Impaired fasting glucose   . Chronic LBP   . Shingles   . Allergic rhinitis   . History of esophageal stricture     Past Surgical History  Procedure Laterality Date  . Appendectomy    . Tonsillectomy    . Right inguinal herniorrhaphy       Current Outpatient Prescriptions  Medication Sig Dispense Refill  . amLODipine (NORVASC) 10 MG tablet Take 10 mg by mouth daily.      Marland Kitchen aspirin 81 MG tablet Take 81 mg by mouth daily.      . cetirizine (ZYRTEC ALLERGY) 10 MG tablet Take 10 mg by mouth daily.      Marland Kitchen ezetimibe  (ZETIA) 10 MG tablet Take 10 mg by mouth daily.    . fluticasone (FLONASE) 50 MCG/ACT nasal spray HOLD RIGHT NOW    . Ginkgo Biloba (GINKOBA) 40 MG TABS Take 40 mg by mouth daily.     Marland Kitchen LIPITOR 40 MG tablet TAKE ONE TABLET BY MOUTH DAILY. 30 tablet 2  . losartan (COZAAR) 100 MG tablet Take 100 mg by mouth daily.      . nitroGLYCERIN (NITROSTAT) 0.4 MG SL tablet Place 0.4 mg under the tongue every 5 (five) minutes as needed for chest pain (chest pain).    . Omega-3 Fatty Acids (FISH OIL) 1200 MG CAPS Take 1 capsule by mouth 2 (two) times daily.     . pantoprazole (PROTONIX) 40 MG tablet TAKE 1 TABLET BY MOUTH ONCE A DAY 30 tablet 5  . Psyllium (METAMUCIL PO) Take 1 Dose by mouth 2 (two) times daily.     Marland Kitchen ZINC SULFATE PO Take 140 mg by mouth daily.     Marland Kitchen zolpidem (AMBIEN) 10 MG tablet Take 10 mg by mouth as needed (sleep).      No current facility-administered medications for this visit.    Allergies:   Bee venom and Codeine  Social History:  The patient  reports that he has quit smoking. He does not have any smokeless tobacco history on file. He reports that he drinks alcohol. He reports that he does not use illicit drugs.   Family History:  The patient's family history includes Diabetes Mellitus II in his father; Healthy in his daughter, daughter, and sister; Heart attack in his father, maternal grandfather, and paternal grandfather; Hypertension in his father.    ROS:   Please see the history of present illness.   ROS    PHYSICAL EXAM: VS:  BP 118/50 mmHg  Pulse 53  Ht 6\' 1"  (1.854 m)  Wt 189 lb 12.8 oz (86.093 kg)  BMI 25.05 kg/m2    Wt Readings from Last 3 Encounters:  12/24/14 189 lb 12.8 oz (86.093 kg)  12/04/12 197 lb (89.359 kg)  11/20/11 200 lb (90.719 kg)     GEN: Well nourished, well developed, in no acute distress HEENT: normal Neck: no JVD, no carotid bruits, no masses Cardiac:  Normal S1/S2, RRR; no murmur ,  no rubs or gallops, no edema   Respiratory:   clear to auscultation bilaterally, no wheezing, rhonchi or rales. GI: soft, nontender, nondistended, + BS MS: no deformity or atrophy Skin: warm and dry  Neuro:  CNs II-XII intact, Strength and sensation are intact Psych: Normal affect   EKG:  EKG is ordered today.  It demonstrates:   Sinus brady, HR 53, normal axis, no ST changes, QTc 416 ms.   Recent Labs: No results found for requested labs within last 365 days.    Lipid Panel    Component Value Date/Time   CHOL 176 01/19/2010 0924   TRIG 63.0 01/19/2010 0924   HDL 67.20 01/19/2010 0924   CHOLHDL 3 01/19/2010 0924   VLDL 12.6 01/19/2010 0924   LDLCALC 96 01/19/2010 0924      ASSESSMENT AND PLAN:  1. CAD:  S/p DES to RCA in 2010.  Last ETT 2013 neg for ischemic ST changes with good exercise tolerance.  Doing well without anginal symptoms.    -  Arrange FU ETT with Dr. Sherren Mocha   -  Continue ASA, statin, angiotensin receptor blocker   2. HTN:  Controlled.   3. Hyperlipidemia:  Lipids from PCP reviewed.  LDL at goal (68).  Continue current dose of statin, Zetia.     Medication Changes: Current medicines are reviewed at length with the patient today.  Concerns regarding medicines are as outlined above.  The following changes have been made:   Discontinued Medications   OLOPATADINE HCL NA    Place 2 sprays into the nose 2 (two) times daily.   Modified Medications   No medications on file   New Prescriptions   No medications on file   Labs/ tests ordered today include:   Orders Placed This Encounter  Procedures  . Exercise Tolerance Test  . EKG 12-Lead      Disposition:    FU with Dr. Sherren Mocha in 1 year.    Signed, Versie Starks, MHS 12/24/2014 1:34 PM    [facility] [place], [place], [place]  [address] Phone: [phone]; Fax: [phone]

## 2014-12-24 ENCOUNTER — Ambulatory Visit (INDEPENDENT_AMBULATORY_CARE_PROVIDER_SITE_OTHER): Payer: Commercial Managed Care - HMO | Admitting: Physician Assistant

## 2014-12-24 ENCOUNTER — Encounter: Payer: Self-pay | Admitting: Physician Assistant

## 2014-12-24 VITALS — BP 118/50 | HR 53 | Ht 73.0 in | Wt 189.8 lb

## 2014-12-24 DIAGNOSIS — E785 Hyperlipidemia, unspecified: Secondary | ICD-10-CM

## 2014-12-24 DIAGNOSIS — I2510 Atherosclerotic heart disease of native coronary artery without angina pectoris: Secondary | ICD-10-CM

## 2014-12-24 DIAGNOSIS — I10 Essential (primary) hypertension: Secondary | ICD-10-CM

## 2014-12-24 NOTE — Patient Instructions (Signed)
Medication Instructions:  Your physician recommends that you continue on your current medications as directed. Please refer to the Current Medication list given to you today.   Labwork: None  Testing/Procedures: Your physician has requested that you have an exercise tolerance test. For further information please visit [email or web address]. Please also follow instruction sheet, as given.    Follow-Up: Your physician wants you to follow-up with Dr. Burt Knack in one year. You will receive a reminder letter in the mail two months in advance. If you don't receive a letter, please call our office to schedule the follow-up appointment.     Any Special Instructions Will Be Listed Below (If Applicable).

## 2015-01-15 ENCOUNTER — Encounter: Payer: Self-pay | Admitting: Physician Assistant

## 2015-01-28 ENCOUNTER — Encounter: Payer: Self-pay | Admitting: Cardiovascular Disease

## 2015-01-28 ENCOUNTER — Other Ambulatory Visit: Payer: Self-pay

## 2015-01-28 DIAGNOSIS — I2510 Atherosclerotic heart disease of native coronary artery without angina pectoris: Secondary | ICD-10-CM

## 2015-01-28 NOTE — Telephone Encounter (Signed)
New message   Pt states he missed a call about an appt with Burt Knack and and EXC test   i offered first available and he declined and said he wants to speak to the rn

## 2015-01-28 NOTE — Telephone Encounter (Signed)
This encounter was created in error - please disregard.

## 2015-02-22 ENCOUNTER — Ambulatory Visit (INDEPENDENT_AMBULATORY_CARE_PROVIDER_SITE_OTHER): Payer: Commercial Managed Care - HMO

## 2015-02-22 ENCOUNTER — Encounter: Payer: Commercial Managed Care - HMO | Admitting: Cardiovascular Disease

## 2015-02-22 DIAGNOSIS — I2510 Atherosclerotic heart disease of native coronary artery without angina pectoris: Secondary | ICD-10-CM | POA: Diagnosis not present

## 2015-02-22 LAB — EXERCISE TOLERANCE TEST
CHL CUP MPHR: 152 {beats}/min
CHL CUP RESTING HR STRESS: 55 {beats}/min
CHL RATE OF PERCEIVED EXERTION: 15
CSEPEDS: 0 s
CSEPHR: 90 %
Estimated workload: 11.7 METS
Exercise duration (min): 10 min
Peak HR: 137 {beats}/min

## 2015-02-22 NOTE — Patient Instructions (Signed)

## 2015-10-28 DIAGNOSIS — Z85828 Personal history of other malignant neoplasm of skin: Secondary | ICD-10-CM | POA: Diagnosis not present

## 2015-10-28 DIAGNOSIS — L249 Irritant contact dermatitis, unspecified cause: Secondary | ICD-10-CM | POA: Diagnosis not present

## 2015-10-28 DIAGNOSIS — L570 Actinic keratosis: Secondary | ICD-10-CM | POA: Diagnosis not present

## 2015-11-09 DIAGNOSIS — M25511 Pain in right shoulder: Secondary | ICD-10-CM | POA: Diagnosis not present

## 2015-11-16 DIAGNOSIS — M25511 Pain in right shoulder: Secondary | ICD-10-CM | POA: Diagnosis not present

## 2015-11-18 DIAGNOSIS — M25511 Pain in right shoulder: Secondary | ICD-10-CM | POA: Diagnosis not present

## 2015-12-02 ENCOUNTER — Encounter: Payer: Self-pay | Admitting: Cardiovascular Disease

## 2015-12-02 DIAGNOSIS — Z125 Encounter for screening for malignant neoplasm of prostate: Secondary | ICD-10-CM | POA: Diagnosis not present

## 2015-12-02 DIAGNOSIS — I10 Essential (primary) hypertension: Secondary | ICD-10-CM | POA: Diagnosis not present

## 2015-12-02 DIAGNOSIS — R7301 Impaired fasting glucose: Secondary | ICD-10-CM | POA: Diagnosis not present

## 2015-12-02 DIAGNOSIS — E784 Other hyperlipidemia: Secondary | ICD-10-CM | POA: Diagnosis not present

## 2015-12-09 DIAGNOSIS — R748 Abnormal levels of other serum enzymes: Secondary | ICD-10-CM | POA: Diagnosis not present

## 2015-12-09 DIAGNOSIS — R7301 Impaired fasting glucose: Secondary | ICD-10-CM | POA: Diagnosis not present

## 2015-12-09 DIAGNOSIS — Z0000 Encounter for general adult medical examination without abnormal findings: Secondary | ICD-10-CM | POA: Diagnosis not present

## 2015-12-09 DIAGNOSIS — Z1389 Encounter for screening for other disorder: Secondary | ICD-10-CM | POA: Diagnosis not present

## 2015-12-09 DIAGNOSIS — Z6826 Body mass index (BMI) 26.0-26.9, adult: Secondary | ICD-10-CM | POA: Diagnosis not present

## 2015-12-09 DIAGNOSIS — I10 Essential (primary) hypertension: Secondary | ICD-10-CM | POA: Diagnosis not present

## 2015-12-09 DIAGNOSIS — K219 Gastro-esophageal reflux disease without esophagitis: Secondary | ICD-10-CM | POA: Diagnosis not present

## 2015-12-09 DIAGNOSIS — I129 Hypertensive chronic kidney disease with stage 1 through stage 4 chronic kidney disease, or unspecified chronic kidney disease: Secondary | ICD-10-CM | POA: Diagnosis not present

## 2015-12-09 DIAGNOSIS — Z23 Encounter for immunization: Secondary | ICD-10-CM | POA: Diagnosis not present

## 2015-12-09 DIAGNOSIS — Z9861 Coronary angioplasty status: Secondary | ICD-10-CM | POA: Diagnosis not present

## 2015-12-09 DIAGNOSIS — E784 Other hyperlipidemia: Secondary | ICD-10-CM | POA: Diagnosis not present

## 2015-12-14 DIAGNOSIS — Z1212 Encounter for screening for malignant neoplasm of rectum: Secondary | ICD-10-CM | POA: Diagnosis not present

## 2016-01-20 DIAGNOSIS — H524 Presbyopia: Secondary | ICD-10-CM | POA: Diagnosis not present

## 2016-02-11 DIAGNOSIS — R748 Abnormal levels of other serum enzymes: Secondary | ICD-10-CM | POA: Diagnosis not present

## 2016-06-16 ENCOUNTER — Ambulatory Visit (INDEPENDENT_AMBULATORY_CARE_PROVIDER_SITE_OTHER): Payer: PPO | Admitting: Cardiovascular Disease

## 2016-06-16 ENCOUNTER — Encounter: Payer: Self-pay | Admitting: Cardiovascular Disease

## 2016-06-16 VITALS — BP 136/78 | HR 72 | Ht 72.0 in | Wt 188.4 lb

## 2016-06-16 DIAGNOSIS — I10 Essential (primary) hypertension: Secondary | ICD-10-CM

## 2016-06-16 NOTE — Patient Instructions (Signed)
Your physician recommends that you continue on your current medications as directed. Please refer to the Current Medication list given to you today.  Your physician wants you to follow-up in: 2 year with Dr. Burt Knack.  You will receive a reminder letter in the mail two months in advance. If you don't receive a letter, please call our office to schedule the follow-up appointment.

## 2016-06-16 NOTE — Progress Notes (Signed)
Cardiology Office Note Date:  06/16/2016   ID:  Ivie, Maese 1945/05/16, MRN [id number]  PCP:  Marton Redwood, MD  Cardiologist:  Sherren Mocha, MD    Chief Complaint  Patient presents with  . Coronary Artery Disease    follow up     History of Present Illness: Nicholas Hernandez is a 71 y.o. male who presents for follow-up evaluation. The patient has been followed for coronary artery disease and he initially presented in 2010 with unstable angina, undergoing PCI of the right coronary artery. He has not had any recurrent symptoms of ischemia since that time. His last stress test in 2013 showed good exercise tolerance at 12 minutes on the Bruce protocol without significant ST changes or symptoms of angina. He was last seen in 2014.  The patient is very active. He walks 18 holes of golf without problems. Plays Pickle ball for hours without problems. Today, he denies symptoms of palpitations, chest pain, shortness of breath, orthopnea, PND, lower extremity edema, dizziness, or syncope.  Past Medical History:  Diagnosis Date  . Allergic rhinitis   . CAD (coronary artery disease)    post 3.5 x 14mm xience durg-eluting stent to distal right coronary artery  . Chronic LBP   . Esophageal stricture    status post dilatation 10-08-08  . Essential hypertension, benign   . External hemorrhoids   . GERD (gastroesophageal reflux disease)   . History of esophageal stricture   . HLD (hyperlipidemia)   . Impaired fasting glucose   . Shingles     Past Surgical History:  Procedure Laterality Date  . APPENDECTOMY    . right inguinal herniorrhaphy    . TONSILLECTOMY      Current Outpatient Prescriptions  Medication Sig Dispense Refill  . amLODipine (NORVASC) 10 MG tablet Take 10 mg by mouth daily.      Marland Kitchen aspirin 81 MG tablet Take 81 mg by mouth daily.      . cetirizine (ZYRTEC ALLERGY) 10 MG tablet Take 10 mg by mouth daily.      Marland Kitchen ezetimibe (ZETIA) 10 MG tablet Take 10 mg by mouth daily.    .  fluticasone (FLONASE) 50 MCG/ACT nasal spray HOLD RIGHT NOW    . Ginkgo Biloba (GINKOBA) 40 MG TABS Take 40 mg by mouth daily.     Marland Kitchen LIPITOR 40 MG tablet TAKE ONE TABLET BY MOUTH DAILY. 30 tablet 2  . nitroGLYCERIN (NITROSTAT) 0.4 MG SL tablet Place 0.4 mg under the tongue every 5 (five) minutes as needed for chest pain (chest pain).    . Omega-3 Fatty Acids (FISH OIL) 1200 MG CAPS Take 1 capsule by mouth 2 (two) times daily.     Marland Kitchen OVER THE COUNTER MEDICATION Take 4 oz by mouth daily. Tart Cherry Juice    . pantoprazole (PROTONIX) 40 MG tablet TAKE 1 TABLET BY MOUTH ONCE A DAY 30 tablet 5  . Psyllium (METAMUCIL PO) Take 1 Dose by mouth 2 (two) times daily.     Marland Kitchen ZINC SULFATE PO Take 140 mg by mouth daily.     Marland Kitchen zolpidem (AMBIEN) 10 MG tablet Take 10 mg by mouth as needed (sleep).      No current facility-administered medications for this visit.     Allergies:   Bee venom and Codeine   Social History:  The patient  reports that he has quit smoking. He has never used smokeless tobacco. He reports that he drinks alcohol. He reports that he  does not use drugs.   Family History:  The patient's  family history includes Diabetes Mellitus II in his father; Healthy in his daughter, daughter, and sister; Heart attack in his father, maternal grandfather, and paternal grandfather; Hypertension in his father.    ROS:  Please see the history of present illness.  All other systems are reviewed and negative.    PHYSICAL EXAM: VS:  BP 136/78   Pulse 72   Ht 6' (1.829 m)   Wt 188 lb 6.4 oz (85.5 kg)   BMI 25.55 kg/m  , BMI Body mass index is 25.55 kg/m. GEN: Well nourished, well developed, in no acute distress  HEENT: normal  Neck: no JVD, no masses. No carotid bruits Cardiac: RRR without murmur or gallop                Respiratory:  clear to auscultation bilaterally, normal work of breathing GI: soft, nontender, nondistended, + BS MS: no deformity or atrophy  Ext: no pretibial edema, pedal  pulses 2+= bilaterally Skin: warm and dry, no rash Neuro:  Strength and sensation are intact Psych: euthymic mood, full affect  EKG:  EKG is ordered today. The ekg ordered today shows NSR 72 bpm, within normal limits  Recent Labs: No results found for requested labs within last 8760 hours.   Lipid Panel     Component Value Date/Time   CHOL 176 01/19/2010 0924   TRIG 63.0 01/19/2010 0924   HDL 67.20 01/19/2010 0924   CHOLHDL 3 01/19/2010 0924   VLDL 12.6 01/19/2010 0924   LDLCALC 96 01/19/2010 0924      Wt Readings from Last 3 Encounters:  06/16/16 188 lb 6.4 oz (85.5 kg)  12/24/14 189 lb 12.8 oz (86.1 kg)  12/04/12 197 lb (89.4 kg)    ASSESSMENT AND PLAN: 1.  CAD, native vessel: no angina at a high workload/good functional capacity. EKG normal and CV risk factors well controlled. He keeps a very active lifestyle and was encouraged to continue as he is doing.   2. Hyperlipidemia: lipids followed by Dr Brigitte Pulse. Treated with Zetia/atorvastatin. Will send for copies to update our records.   3. Hypertension: BP well-controlled on amlodipine. He was able to reduce his antihypertensive medication with significant weight loss.  Current medicines are reviewed with the patient today.  The patient does not have concerns regarding medicines.  Labs/ tests ordered today include:   Orders Placed This Encounter  Procedures  . EKG 12-Lead    Disposition:   FU two years  Signed, Sherren Mocha, MD  06/16/2016 5:32 PM    [facility] [place], [place], [place]  [address] Phone: [phone]; Fax: [phone]

## 2016-10-05 ENCOUNTER — Other Ambulatory Visit: Payer: Self-pay | Admitting: *Deleted

## 2016-10-05 NOTE — Patient Outreach (Signed)
[facility] Screening call, unsuccessful but left a message for a return call. If I do not hear back from the member I will try again within the week.  Sigismund Cross C. Kameran Mcneese, MSN, GNP-BC Gerontological Nurse Practitioner [facility] Care Management [phone]  

## 2016-10-06 ENCOUNTER — Other Ambulatory Visit: Payer: Self-pay | Admitting: *Deleted

## 2016-10-06 NOTE — Patient Outreach (Signed)
[facility] 2nd Screening call, unsuccessful but left a message for a return call. If I do not hear back from the member I will try again within the week.  Eulah Pont. Myrtie Neither, MSN, [facility] Gerontological Nurse Practitioner [facility] Care Management [phone]

## 2016-10-09 ENCOUNTER — Other Ambulatory Visit: Payer: Self-pay | Admitting: *Deleted

## 2016-10-09 ENCOUNTER — Encounter: Payer: Self-pay | Admitting: *Deleted

## 2016-10-09 NOTE — Patient Outreach (Signed)
[facility] 3rd Screening call, unsuccessful but again I have left a message for a return call. I will send the member a letter about our services.  Eulah Pont. Myrtie Neither, MSN, [facility] Gerontological Nurse Practitioner [facility] Care Management [phone]

## 2016-10-09 NOTE — Patient Outreach (Signed)
[facility] Screen. Pt called me back after having left a 3rd message. He reports he sees his primary care MD routinely. Takes medications as ordered for chronic problems. No acute health concerns. No care management needs. I advised I have already requested a letter to be sent to him and it will read as though I have not talked with him. I have encouraged him to call us if any new problem arises that he may need assistance with.   Eulah Pont. Myrtie Neither, MSN, [facility] Gerontological Nurse Practitioner [facility] Care Management [phone]

## 2016-10-26 DIAGNOSIS — L570 Actinic keratosis: Secondary | ICD-10-CM | POA: Diagnosis not present

## 2016-10-26 DIAGNOSIS — Z85828 Personal history of other malignant neoplasm of skin: Secondary | ICD-10-CM | POA: Diagnosis not present

## 2016-10-26 DIAGNOSIS — I872 Venous insufficiency (chronic) (peripheral): Secondary | ICD-10-CM | POA: Diagnosis not present

## 2016-12-12 DIAGNOSIS — I129 Hypertensive chronic kidney disease with stage 1 through stage 4 chronic kidney disease, or unspecified chronic kidney disease: Secondary | ICD-10-CM | POA: Diagnosis not present

## 2016-12-12 DIAGNOSIS — Z125 Encounter for screening for malignant neoplasm of prostate: Secondary | ICD-10-CM | POA: Diagnosis not present

## 2016-12-12 DIAGNOSIS — E784 Other hyperlipidemia: Secondary | ICD-10-CM | POA: Diagnosis not present

## 2016-12-12 DIAGNOSIS — I10 Essential (primary) hypertension: Secondary | ICD-10-CM | POA: Diagnosis not present

## 2016-12-12 DIAGNOSIS — R7301 Impaired fasting glucose: Secondary | ICD-10-CM | POA: Diagnosis not present

## 2016-12-19 DIAGNOSIS — Z23 Encounter for immunization: Secondary | ICD-10-CM | POA: Diagnosis not present

## 2016-12-19 DIAGNOSIS — E784 Other hyperlipidemia: Secondary | ICD-10-CM | POA: Diagnosis not present

## 2016-12-19 DIAGNOSIS — K219 Gastro-esophageal reflux disease without esophagitis: Secondary | ICD-10-CM | POA: Diagnosis not present

## 2016-12-19 DIAGNOSIS — D751 Secondary polycythemia: Secondary | ICD-10-CM | POA: Diagnosis not present

## 2016-12-19 DIAGNOSIS — R7301 Impaired fasting glucose: Secondary | ICD-10-CM | POA: Diagnosis not present

## 2016-12-19 DIAGNOSIS — Z6826 Body mass index (BMI) 26.0-26.9, adult: Secondary | ICD-10-CM | POA: Diagnosis not present

## 2016-12-19 DIAGNOSIS — R809 Proteinuria, unspecified: Secondary | ICD-10-CM | POA: Diagnosis not present

## 2016-12-19 DIAGNOSIS — I10 Essential (primary) hypertension: Secondary | ICD-10-CM | POA: Diagnosis not present

## 2016-12-19 DIAGNOSIS — Z1389 Encounter for screening for other disorder: Secondary | ICD-10-CM | POA: Diagnosis not present

## 2016-12-19 DIAGNOSIS — Z9861 Coronary angioplasty status: Secondary | ICD-10-CM | POA: Diagnosis not present

## 2016-12-19 DIAGNOSIS — Z0000 Encounter for general adult medical examination without abnormal findings: Secondary | ICD-10-CM | POA: Diagnosis not present

## 2016-12-27 DIAGNOSIS — Z1212 Encounter for screening for malignant neoplasm of rectum: Secondary | ICD-10-CM | POA: Diagnosis not present

## 2016-12-27 DIAGNOSIS — Z85828 Personal history of other malignant neoplasm of skin: Secondary | ICD-10-CM | POA: Diagnosis not present

## 2016-12-27 DIAGNOSIS — C44722 Squamous cell carcinoma of skin of right lower limb, including hip: Secondary | ICD-10-CM | POA: Diagnosis not present

## 2016-12-27 DIAGNOSIS — Z872 Personal history of diseases of the skin and subcutaneous tissue: Secondary | ICD-10-CM | POA: Diagnosis not present

## 2016-12-27 DIAGNOSIS — I872 Venous insufficiency (chronic) (peripheral): Secondary | ICD-10-CM | POA: Diagnosis not present

## 2017-02-08 DIAGNOSIS — I10 Essential (primary) hypertension: Secondary | ICD-10-CM | POA: Diagnosis not present

## 2017-02-16 ENCOUNTER — Telehealth: Payer: Self-pay | Admitting: Hematology and Oncology

## 2017-02-16 NOTE — Telephone Encounter (Signed)
Spoke with patient wife re 12/4 new patient appointment with Dr. Lebron Conners @ 11 am. Wife given date/time/location. Demographic/insurance information confirmed.   Left message for Misty at [place] with appointments details.

## 2017-02-27 ENCOUNTER — Ambulatory Visit (HOSPITAL_BASED_OUTPATIENT_CLINIC_OR_DEPARTMENT_OTHER): Payer: PPO | Admitting: Hematology and Oncology

## 2017-02-27 ENCOUNTER — Encounter: Payer: Self-pay | Admitting: Hematology and Oncology

## 2017-02-27 ENCOUNTER — Ambulatory Visit (HOSPITAL_BASED_OUTPATIENT_CLINIC_OR_DEPARTMENT_OTHER): Payer: PPO

## 2017-02-27 VITALS — BP 134/80 | HR 59 | Temp 97.9°F | Resp 18 | Wt 190.3 lb

## 2017-02-27 DIAGNOSIS — D582 Other hemoglobinopathies: Secondary | ICD-10-CM | POA: Diagnosis not present

## 2017-02-27 DIAGNOSIS — R609 Edema, unspecified: Secondary | ICD-10-CM

## 2017-02-27 DIAGNOSIS — Z87891 Personal history of nicotine dependence: Secondary | ICD-10-CM

## 2017-02-27 LAB — CBC & DIFF AND RETIC
BASO%: 0.5 % (ref 0.0–2.0)
BASOS ABS: 0 10*3/uL (ref 0.0–0.1)
EOS%: 2.5 % (ref 0.0–7.0)
Eosinophils Absolute: 0.2 10*3/uL (ref 0.0–0.5)
HEMATOCRIT: 54 % — AB (ref 38.4–49.9)
HGB: 18.2 g/dL — ABNORMAL HIGH (ref 13.0–17.1)
Immature Retic Fract: 3.2 % (ref 3.00–10.60)
LYMPH%: 19.1 % (ref 14.0–49.0)
MCH: 32 pg (ref 27.2–33.4)
MCHC: 33.7 g/dL (ref 32.0–36.0)
MCV: 94.9 fL (ref 79.3–98.0)
MONO#: 0.7 10*3/uL (ref 0.1–0.9)
MONO%: 7.7 % (ref 0.0–14.0)
NEUT#: 5.9 10*3/uL (ref 1.5–6.5)
NEUT%: 70.2 % (ref 39.0–75.0)
Platelets: 229 10*3/uL (ref 140–400)
RBC: 5.69 10*6/uL (ref 4.20–5.82)
RDW: 13.5 % (ref 11.0–14.6)
RETIC %: 1.14 % (ref 0.80–1.80)
RETIC CT ABS: 64.87 10*3/uL (ref 34.80–93.90)
WBC: 8.5 10*3/uL (ref 4.0–10.3)
lymph#: 1.6 10*3/uL (ref 0.9–3.3)

## 2017-02-27 LAB — COMPREHENSIVE METABOLIC PANEL
ALBUMIN: 4.4 g/dL (ref 3.5–5.0)
ALK PHOS: 137 U/L (ref 40–150)
ALT: 23 U/L (ref 0–55)
AST: 27 U/L (ref 5–34)
Anion Gap: 11 mEq/L (ref 3–11)
BUN: 14.5 mg/dL (ref 7.0–26.0)
CALCIUM: 10 mg/dL (ref 8.4–10.4)
CO2: 26 mEq/L (ref 22–29)
CREATININE: 1.1 mg/dL (ref 0.7–1.3)
Chloride: 104 mEq/L (ref 98–109)
EGFR: >60 mL/min/{1.73_m2} (ref >60)
GLUCOSE: 109 mg/dL (ref 70–140)
POTASSIUM: 4 meq/L (ref 3.5–5.1)
SODIUM: 141 meq/L (ref 136–145)
TOTAL PROTEIN: 8 g/dL (ref 6.4–8.3)
Total Bilirubin: 1.83 mg/dL — ABNORMAL HIGH (ref 0.20–1.20)

## 2017-02-27 LAB — LACTATE DEHYDROGENASE: LDH: 232 U/L (ref 125–245)

## 2017-02-28 LAB — ERYTHROPOIETIN: Erythropoietin: 4.7 m[IU]/mL (ref 2.6–18.5)

## 2017-03-07 ENCOUNTER — Ambulatory Visit (HOSPITAL_COMMUNITY)
Admission: RE | Admit: 2017-03-07 | Discharge: 2017-03-07 | Disposition: A | Discharge status: Home / Self Care | Payer: PPO | Source: Ambulatory Visit | Attending: Hematology and Oncology | Admitting: Hematology and Oncology

## 2017-03-07 DIAGNOSIS — D582 Other hemoglobinopathies: Secondary | ICD-10-CM | POA: Diagnosis not present

## 2017-03-07 DIAGNOSIS — J449 Chronic obstructive pulmonary disease, unspecified: Secondary | ICD-10-CM | POA: Diagnosis not present

## 2017-03-07 LAB — PULMONARY FUNCTION TEST
DL/VA % pred: 84 %
DL/VA: 3.92 ml/min/mmHg/L
DLCO COR % PRED: 72 %
DLCO COR: 24.4 ml/min/mmHg
DLCO unc % pred: 77 %
DLCO unc: 26.18 ml/min/mmHg
FEF 25-75 POST: 2 L/s
FEF 25-75 Pre: 2.19 L/sec
FEF2575-%CHANGE-POST: -8 %
FEF2575-%PRED-POST: 78 %
FEF2575-%PRED-PRE: 85 %
FEV1-%CHANGE-POST: -1 %
FEV1-%Pred-Post: 93 %
FEV1-%Pred-Pre: 94 %
FEV1-POST: 3.14 L
FEV1-Pre: 3.18 L
FEV1FVC-%CHANGE-POST: 1 %
FEV1FVC-%PRED-PRE: 96 %
FEV6-%Change-Post: -2 %
FEV6-%Pred-Post: 96 %
FEV6-%Pred-Pre: 98 %
FEV6-Post: 4.16 L
FEV6-Pre: 4.27 L
FEV6FVC-%Change-Post: 0 %
FEV6FVC-%PRED-POST: 101 %
FEV6FVC-%Pred-Pre: 101 %
FVC-%Change-Post: -2 %
FVC-%PRED-POST: 94 %
FVC-%PRED-PRE: 96 %
FVC-POST: 4.31 L
FVC-PRE: 4.43 L
POST FEV1/FVC RATIO: 73 %
PRE FEV1/FVC RATIO: 72 %
PRE FEV6/FVC RATIO: 96 %
Post FEV6/FVC ratio: 96 %
RV % pred: 169 %
RV: 4.25 L
TLC % PRED: 118 %
TLC: 8.56 L

## 2017-03-07 LAB — BLOOD GAS, ARTERIAL
ACID-BASE DEFICIT: 0.7 mmol/L (ref 0.0–2.0)
BICARBONATE: 23 mmol/L (ref 20.0–28.0)
Drawn by: 21179
FIO2: 21
O2 Saturation: 96.4 %
Patient temperature: 98.6
pCO2 arterial: 37.1 mmHg (ref 32.0–48.0)
pH, Arterial: 7.409 (ref 7.350–7.450)
pO2, Arterial: 87.4 mmHg (ref 83.0–108.0)

## 2017-03-07 MED ORDER — ALBUTEROL SULFATE (2.5 MG/3ML) 0.083% IN NEBU
2.5000 mg | INHALATION_SOLUTION | Freq: Once | RESPIRATORY_TRACT | Status: AC
  Administered 2017-03-07: 2.5 mg via RESPIRATORY_TRACT

## 2017-03-31 DIAGNOSIS — D751 Secondary polycythemia: Secondary | ICD-10-CM | POA: Insufficient documentation

## 2017-03-31 NOTE — Progress Notes (Signed)
[facility] New Visit:  Assessment: Elevated hemoglobin ([place]) 72 y.o. male with 20-pack-year history of smoking which he has quit in 1989.  Significantly elevated hemoglobin levels over the past 2 years at least with hematocrit exceeding 50.  No significant symptoms to suggest polycythemia at this time.  Platelet and white blood cell counts are normal.  Unclear whether we are dealing with a primary or secondary polycythemia in this instance.  We will initiate workup as outlined below.  Plan:  -- Labs today including repeat CBC with differential, CMP, erythropoietin level, and testing for MPN mutations --Pulmonary function tests --Return to clinic in 1 month for review  Voice recognition software was used and creation of this note. Despite my best effort at editing the text, some misspelling/errors may have occurred.  Orders Placed This Encounter  Procedures  . CBC & Diff and Retic    Standing Status:   Future    Number of Occurrences:   1    Standing Expiration Date:   02/27/2018  . Comprehensive metabolic panel    Standing Status:   Future    Number of Occurrences:   1    Standing Expiration Date:   02/27/2018  . Lactate dehydrogenase (LDH)    Standing Status:   Future    Number of Occurrences:   1    Standing Expiration Date:   02/27/2018  . Erythropoietin    Standing Status:   Future    Number of Occurrences:   1    Standing Expiration Date:   02/27/2018  . JAK2 (INCLUDING V617F AND EXON 12), MPL,& CALR W/RFL MPN PANEL (NGS)    Standing Status:   Future    Number of Occurrences:   1    Standing Expiration Date:   02/27/2018  . Pulmonary Function Test    Standing Status:   Future    Number of Occurrences:   1    Standing Expiration Date:   02/27/2018    Order Specific Question:   Where should this test be performed?    Answer:   [facility]    Order Specific Question:   Full PFT: includes the following: basic spirometry, spirometry pre & post  bronchodilator, diffusion capacity (DLCO), lung volumes    Answer:   Full PFT    Order Specific Question:   MIP/MEP    Answer:   No    Order Specific Question:   6 minute walk    Answer:   Yes    Order Specific Question:   ABG    Answer:   Yes    Order Specific Question:   Diffusion capacity (DLCO)    Answer:   Yes    Order Specific Question:   Lung volumes    Answer:   Yes    Order Specific Question:   Methacholine challenge    Answer:   No    All questions were answered.  . The patient knows to call the clinic with any problems, questions or concerns.  This note was electronically signed.    History of Presenting Illness Nicholas Hernandez 72 y.o. presenting to the [facility] for evaluation of "secondary polycythemia", referred by Dr Marton Redwood.  Please see  the available hematological data below for details. Patient is a former smoker,  Quit in 1989 presently, reports no fevers, chills, night sweats.  Has been experiencing bilateral lower extremity swelling since August of this year..  Occasional headaches, but none persisting.  No neurological deficits.  Patient denies any shortness of breath, chest pain, or cough.  No nausea, vomiting, abdominal pain, diarrhea, or constipation.  He reports easy bruising since coronary artery stent was placed and he has been on antiplatelet therapy.  He also reports having a reknot syndrome, but on additional questioning he does not have the full pattern of pallor or cyanosis on cold exposure followed by hyperemia that would characterize the presentation.  No history of digital ulcers.  Oncological/hematological History: --Labs, 12/02/15: WBC 7.6, Hgb 17.5, Hct 53.8, Plt 222; --Labs, 02/08/17: WBC 8.6, Hgb 18.8, Hct 55.1, Plt 248;  Medical History: Past Medical History:  Diagnosis Date  . Allergic rhinitis   . CAD (coronary artery disease)    post 3.5 x 48m xience durg-eluting stent to distal right coronary artery  . Chronic LBP   .  Esophageal stricture    status post dilatation 10-08-08  . Essential hypertension, benign   . External hemorrhoids   . GERD (gastroesophageal reflux disease)   . History of esophageal stricture   . HLD (hyperlipidemia)   . Impaired fasting glucose   . Shingles     Surgical History: Past Surgical History:  Procedure Laterality Date  . APPENDECTOMY    . right inguinal herniorrhaphy    . TONSILLECTOMY      Family History: Family History  Problem Relation Age of Onset  . Heart attack Father        MI at [phone] . Hypertension Father   . Diabetes Mellitus II Father   . Heart attack Paternal Grandfather   . Heart attack Maternal Grandfather   . Healthy Sister   . Healthy Daughter   . Healthy Daughter     Social History: Social History   Socioeconomic History  . Marital status: Married    Spouse name: Not on file  . Number of children: Not on file  . Years of education: Not on file  . Highest education level: Not on file  Social Needs  . Financial resource strain: Not on file  . Food insecurity - worry: Not on file  . Food insecurity - inability: Not on file  . Transportation needs - medical: Not on file  . Transportation needs - non-medical: Not on file  Occupational History  . Not on file  Tobacco Use  . Smoking status: Former SResearch scientist (life sciences) . Smokeless tobacco: Never Used  Substance and Sexual Activity  . Alcohol use: Yes    Alcohol/week: 0.0 oz    Comment: occas  . Drug use: No  . Sexual activity: Not on file  Other Topics Concern  . Not on file  Social History Narrative  . Not on file    Allergies: Allergies  Allergen Reactions  . Bee Venom Swelling  . Codeine Nausea And Vomiting    Medications:  Current Outpatient Medications  Medication Sig Dispense Refill  . amLODipine (NORVASC) 10 MG tablet Take 10 mg by mouth daily.      .Marland Kitchenaspirin 81 MG tablet Take 81 mg by mouth daily.      . cetirizine (ZYRTEC ALLERGY) 10 MG tablet Take 10 mg by mouth daily.       .Marland Kitchenezetimibe (ZETIA) 10 MG tablet Take 10 mg by mouth daily.    . fluticasone (FLONASE) 50 MCG/ACT nasal spray HOLD RIGHT NOW    . Ginkgo Biloba (GINKOBA) 40 MG TABS Take 40 mg by mouth daily.     .Marland KitchenLIPITOR 40 MG tablet TAKE  ONE TABLET BY MOUTH DAILY. 30 tablet 2  . nitroGLYCERIN (NITROSTAT) 0.4 MG SL tablet Place 0.4 mg under the tongue every 5 (five) minutes as needed for chest pain (chest pain).    Marland Kitchen OVER THE COUNTER MEDICATION Take 4 oz by mouth daily. Tart Cherry Juice    . pantoprazole (PROTONIX) 40 MG tablet TAKE 1 TABLET BY MOUTH ONCE A DAY 30 tablet 5  . Psyllium (METAMUCIL PO) Take 1 Dose by mouth 2 (two) times daily.     Marland Kitchen ZINC SULFATE PO Take 100 mg by mouth daily.     Marland Kitchen zolpidem (AMBIEN) 10 MG tablet Take 10 mg by mouth as needed (sleep).     . Omega-3 Fatty Acids (FISH OIL) 1200 MG CAPS Take 1 capsule by mouth 2 (two) times daily.      No current facility-administered medications for this visit.     Review of Systems: Review of Systems  Cardiovascular: Positive for leg swelling.  Neurological: Positive for headaches.  Hematological: Bruises/bleeds easily.  All other systems reviewed and are negative.    PHYSICAL EXAMINATION Blood pressure 134/80, pulse (!) 59, temperature 97.9 F (36.6 C), temperature source Oral, resp. rate 18, weight 190 lb 4.8 oz (86.3 kg), SpO2 98 %.  ECOG PERFORMANCE STATUS: 1 - Symptomatic but completely ambulatory  Physical Exam  Constitutional: He is oriented to person, place, and time and well-developed, well-nourished, and in no distress. No distress.  HENT:  Head: Normocephalic and atraumatic.  Mouth/Throat: Oropharynx is clear and moist. No oropharyngeal exudate.  Eyes: Conjunctivae and EOM are normal. Pupils are equal, round, and reactive to light. No scleral icterus.  Neck: No thyromegaly present.  Cardiovascular: Normal rate, regular rhythm, normal heart sounds and intact distal pulses.  No murmur heard. Pulmonary/Chest: Effort  normal and breath sounds normal. No respiratory distress. He has no wheezes. He has no rales.  Abdominal: Soft. Bowel sounds are normal. He exhibits no distension. There is no tenderness. There is no rebound and no guarding.  Musculoskeletal: He exhibits edema.  Lymphadenopathy:    He has no cervical adenopathy.  Neurological: He is alert and oriented to person, place, and time. He has normal reflexes. No cranial nerve deficit.  Skin: Skin is warm. No rash noted. He is not diaphoretic. No erythema. No pallor.     LABORATORY DATA: I have personally reviewed the data as listed: Appointment on 02/27/2017  Component Date Value Ref Range Status  . Erythropoietin 02/27/2017 4.7  2.6 - 18.5 mIU/mL Final   Beckman Coulter UniCel DxI 800 Immunoassay System  . LDH 02/27/2017 232  125 - 245 U/L Final  . Sodium 02/27/2017 141  136 - 145 mEq/L Final  . Potassium 02/27/2017 4.0  3.5 - 5.1 mEq/L Final  . Chloride 02/27/2017 104  98 - 109 mEq/L Final  . CO2 02/27/2017 26  22 - 29 mEq/L Final  . Glucose 02/27/2017 109  70 - 140 mg/dl Final   Glucose reference range is for nonfasting patients. Fasting glucose reference range is 70- 100.  Marland Kitchen BUN 02/27/2017 14.5  7.0 - 26.0 mg/dL Final  . Creatinine 02/27/2017 1.1  0.7 - 1.3 mg/dL Final  . Total Bilirubin 02/27/2017 1.83* 0.20 - 1.20 mg/dL Final  . Alkaline Phosphatase 02/27/2017 137  40 - 150 U/L Final  . AST 02/27/2017 27  5 - 34 U/L Final  . ALT 02/27/2017 23  0 - 55 U/L Final  . Total Protein 02/27/2017 8.0  6.4 - 8.3 g/dL  Final  . Albumin 02/27/2017 4.4  3.5 - 5.0 g/dL Final  . Calcium 02/27/2017 10.0  8.4 - 10.4 mg/dL Final  . Anion Gap 02/27/2017 11  3 - 11 mEq/L Final  . EGFR 02/27/2017 >60  >60 ml/min/1.73 m2 Final   eGFR is calculated using the CKD-EPI Creatinine Equation (2009)  . WBC 02/27/2017 8.5  4.0 - 10.3 10e3/uL Final  . NEUT# 02/27/2017 5.9  1.5 - 6.5 10e3/uL Final  . HGB 02/27/2017 18.2* 13.0 - 17.1 g/dL Final  . HCT 02/27/2017  54.0* 38.4 - 49.9 % Final  . Platelets 02/27/2017 229  140 - 400 10e3/uL Final  . MCV 02/27/2017 94.9  79.3 - 98.0 fL Final  . MCH 02/27/2017 32.0  27.2 - 33.4 pg Final  . MCHC 02/27/2017 33.7  32.0 - 36.0 g/dL Final  . RBC 02/27/2017 5.69  4.20 - 5.82 10e6/uL Final  . RDW 02/27/2017 13.5  11.0 - 14.6 % Final  . lymph# 02/27/2017 1.6  0.9 - 3.3 10e3/uL Final  . MONO# 02/27/2017 0.7  0.1 - 0.9 10e3/uL Final  . Eosinophils Absolute 02/27/2017 0.2  0.0 - 0.5 10e3/uL Final  . Basophils Absolute 02/27/2017 0.0  0.0 - 0.1 10e3/uL Final  . NEUT% 02/27/2017 70.2  39.0 - 75.0 % Final  . LYMPH% 02/27/2017 19.1  14.0 - 49.0 % Final  . MONO% 02/27/2017 7.7  0.0 - 14.0 % Final  . EOS% 02/27/2017 2.5  0.0 - 7.0 % Final  . BASO% 02/27/2017 0.5  0.0 - 2.0 % Final  . Retic % 02/27/2017 1.14  0.80 - 1.80 % Final  . Retic Ct Abs 02/27/2017 64.87  34.80 - 93.90 10e3/uL Final  . Immature Retic Fract 02/27/2017 3.20  3.00 - 10.60 % Final         Ardath Sax, MD

## 2017-03-31 NOTE — Assessment & Plan Note (Signed)
72 y.o. male with 20-pack-year history of smoking which he has quit in 1989.  Significantly elevated hemoglobin levels over the past 2 years at least with hematocrit exceeding 50.  No significant symptoms to suggest polycythemia at this time.  Platelet and white blood cell counts are normal.  Unclear whether we are dealing with a primary or secondary polycythemia in this instance.  We will initiate workup as outlined below.  Plan:  -- Labs today including repeat CBC with differential, CMP, erythropoietin level, and testing for MPN mutations --Pulmonary function tests --Return to clinic in 1 month for review

## 2017-04-06 ENCOUNTER — Other Ambulatory Visit: Payer: Self-pay

## 2017-04-06 ENCOUNTER — Telehealth: Payer: Self-pay | Admitting: Hematology and Oncology

## 2017-04-06 ENCOUNTER — Inpatient Hospital Stay: Payer: PPO | Attending: Hematology and Oncology | Admitting: Hematology and Oncology

## 2017-04-06 DIAGNOSIS — Z87891 Personal history of nicotine dependence: Secondary | ICD-10-CM | POA: Diagnosis not present

## 2017-04-06 DIAGNOSIS — K222 Esophageal obstruction: Secondary | ICD-10-CM | POA: Insufficient documentation

## 2017-04-06 DIAGNOSIS — Z9049 Acquired absence of other specified parts of digestive tract: Secondary | ICD-10-CM | POA: Insufficient documentation

## 2017-04-06 DIAGNOSIS — Z8619 Personal history of other infectious and parasitic diseases: Secondary | ICD-10-CM

## 2017-04-06 DIAGNOSIS — Z79899 Other long term (current) drug therapy: Secondary | ICD-10-CM | POA: Insufficient documentation

## 2017-04-06 DIAGNOSIS — K219 Gastro-esophageal reflux disease without esophagitis: Secondary | ICD-10-CM | POA: Diagnosis not present

## 2017-04-06 DIAGNOSIS — E785 Hyperlipidemia, unspecified: Secondary | ICD-10-CM | POA: Diagnosis not present

## 2017-04-06 DIAGNOSIS — R51 Headache: Secondary | ICD-10-CM | POA: Diagnosis not present

## 2017-04-06 DIAGNOSIS — I959 Hypotension, unspecified: Secondary | ICD-10-CM

## 2017-04-06 DIAGNOSIS — K644 Residual hemorrhoidal skin tags: Secondary | ICD-10-CM | POA: Insufficient documentation

## 2017-04-06 DIAGNOSIS — M7989 Other specified soft tissue disorders: Secondary | ICD-10-CM | POA: Diagnosis not present

## 2017-04-06 DIAGNOSIS — D751 Secondary polycythemia: Secondary | ICD-10-CM | POA: Diagnosis not present

## 2017-04-06 DIAGNOSIS — Z7982 Long term (current) use of aspirin: Secondary | ICD-10-CM | POA: Insufficient documentation

## 2017-04-06 DIAGNOSIS — I10 Essential (primary) hypertension: Secondary | ICD-10-CM | POA: Diagnosis not present

## 2017-04-06 DIAGNOSIS — D582 Other hemoglobinopathies: Secondary | ICD-10-CM

## 2017-04-06 DIAGNOSIS — I2510 Atherosclerotic heart disease of native coronary artery without angina pectoris: Secondary | ICD-10-CM

## 2017-04-06 NOTE — Telephone Encounter (Signed)
Gave avs and calendar for january °

## 2017-04-11 ENCOUNTER — Other Ambulatory Visit: Payer: Self-pay | Admitting: Radiology

## 2017-04-12 ENCOUNTER — Ambulatory Visit (HOSPITAL_COMMUNITY)
Admission: RE | Admit: 2017-04-12 | Discharge: 2017-04-12 | Disposition: A | Discharge status: Home / Self Care | Payer: PPO | Source: Ambulatory Visit | Attending: Hematology and Oncology | Admitting: Hematology and Oncology

## 2017-04-12 ENCOUNTER — Encounter (HOSPITAL_COMMUNITY): Payer: Self-pay

## 2017-04-12 DIAGNOSIS — I10 Essential (primary) hypertension: Secondary | ICD-10-CM | POA: Insufficient documentation

## 2017-04-12 DIAGNOSIS — D751 Secondary polycythemia: Secondary | ICD-10-CM | POA: Insufficient documentation

## 2017-04-12 DIAGNOSIS — I2510 Atherosclerotic heart disease of native coronary artery without angina pectoris: Secondary | ICD-10-CM | POA: Diagnosis not present

## 2017-04-12 DIAGNOSIS — Z79899 Other long term (current) drug therapy: Secondary | ICD-10-CM | POA: Insufficient documentation

## 2017-04-12 DIAGNOSIS — E785 Hyperlipidemia, unspecified: Secondary | ICD-10-CM | POA: Insufficient documentation

## 2017-04-12 DIAGNOSIS — Z87891 Personal history of nicotine dependence: Secondary | ICD-10-CM | POA: Diagnosis not present

## 2017-04-12 DIAGNOSIS — Z7982 Long term (current) use of aspirin: Secondary | ICD-10-CM | POA: Insufficient documentation

## 2017-04-12 DIAGNOSIS — Z1589 Genetic susceptibility to other disease: Secondary | ICD-10-CM | POA: Diagnosis not present

## 2017-04-12 DIAGNOSIS — K219 Gastro-esophageal reflux disease without esophagitis: Secondary | ICD-10-CM | POA: Insufficient documentation

## 2017-04-12 DIAGNOSIS — D582 Other hemoglobinopathies: Secondary | ICD-10-CM | POA: Diagnosis not present

## 2017-04-12 DIAGNOSIS — R718 Other abnormality of red blood cells: Secondary | ICD-10-CM | POA: Diagnosis not present

## 2017-04-12 LAB — CBC WITH DIFFERENTIAL/PLATELET
BASOS PCT: 1 %
Basophils Absolute: 0.1 10*3/uL (ref 0.0–0.1)
EOS ABS: 0.3 10*3/uL (ref 0.0–0.7)
Eosinophils Relative: 4 %
HCT: 53.1 % — ABNORMAL HIGH (ref 39.0–52.0)
HEMOGLOBIN: 18 g/dL — AB (ref 13.0–17.0)
Lymphocytes Relative: 23 %
Lymphs Abs: 2.1 10*3/uL (ref 0.7–4.0)
MCH: 31.7 pg (ref 26.0–34.0)
MCHC: 33.9 g/dL (ref 30.0–36.0)
MCV: 93.5 fL (ref 78.0–100.0)
Monocytes Absolute: 0.8 10*3/uL (ref 0.1–1.0)
Monocytes Relative: 8 %
NEUTROS PCT: 64 %
Neutro Abs: 6.1 10*3/uL (ref 1.7–7.7)
Platelets: 231 10*3/uL (ref 150–400)
RBC: 5.68 MIL/uL (ref 4.22–5.81)
RDW: 13.7 % (ref 11.5–15.5)
WBC: 9.4 10*3/uL (ref 4.0–10.5)

## 2017-04-12 LAB — PROTIME-INR
INR: 0.98
PROTHROMBIN TIME: 12.9 s (ref 11.4–15.2)

## 2017-04-12 MED ORDER — LIDOCAINE HCL 1 % IJ SOLN
INTRAMUSCULAR | Status: AC | PRN
  Administered 2017-04-12: 20 mL

## 2017-04-12 MED ORDER — SODIUM CHLORIDE 0.9 % IV SOLN
INTRAVENOUS | Status: DC
  Administered 2017-04-12: 09:00:00 via INTRAVENOUS

## 2017-04-12 MED ORDER — FENTANYL CITRATE (PF) 100 MCG/2ML IJ SOLN
INTRAMUSCULAR | Status: AC | PRN
  Administered 2017-04-12 (×2): 50 ug via INTRAVENOUS

## 2017-04-12 MED ORDER — FENTANYL CITRATE (PF) 100 MCG/2ML IJ SOLN
INTRAMUSCULAR | Status: AC
  Filled 2017-04-12: qty 2

## 2017-04-12 MED ORDER — MIDAZOLAM HCL 2 MG/2ML IJ SOLN
INTRAMUSCULAR | Status: AC | PRN
  Administered 2017-04-12 (×2): 1 mg via INTRAVENOUS

## 2017-04-12 MED ORDER — MIDAZOLAM HCL 2 MG/2ML IJ SOLN
INTRAMUSCULAR | Status: AC
  Filled 2017-04-12: qty 2

## 2017-04-12 NOTE — Discharge Instructions (Signed)
You may bathe and take dressing off in 24 hours.   Bone Marrow Aspiration and Bone Marrow Biopsy, Adult, Care After This sheet gives you information about how to care for yourself after your procedure. Your health care provider may also give you more specific instructions. If you have problems or questions, contact your health care provider. What can I expect after the procedure? After the procedure, it is common to have:  Mild pain and tenderness.  Swelling.  Bruising.  Follow these instructions at home:  Take over-the-counter or prescription medicines only as told by your health care provider.  Do not take baths, swim, or use a hot tub until your health care provider approves. Ask if you can take a shower or have a sponge bath.  Follow instructions from your health care provider about how to take care of the puncture site. Make sure you: ? Wash your hands with soap and water before you change your bandage (dressing). If soap and water are not available, use hand sanitizer. ? Change your dressing as told by your health care provider.  Check your puncture siteevery day for signs of infection. Check for: ? More redness, swelling, or pain. ? More fluid or blood. ? Warmth. ? Pus or a bad smell.  Return to your normal activities as told by your health care provider. Ask your health care provider what activities are safe for you.  Do not drive for 24 hours if you were given a medicine to help you relax (sedative).  Keep all follow-up visits as told by your health care provider. This is important. Contact a health care provider if:  You have more redness, swelling, or pain around the puncture site.  You have more fluid or blood coming from the puncture site.  Your puncture site feels warm to the touch.  You have pus or a bad smell coming from the puncture site.  You have a fever.  Your pain is not controlled with medicine. This information is not intended to replace advice  given to you by your health care provider. Make sure you discuss any questions you have with your health care provider. Document Released: 09/30/2004 Document Revised: 10/01/2015 Document Reviewed: 08/25/2015 Elsevier Interactive Patient Education  2018 [place].     Moderate Conscious Sedation, Adult, Care After These instructions provide you with information about caring for yourself after your procedure. Your health care provider may also give you more specific instructions. Your treatment has been planned according to current medical practices, but problems sometimes occur. Call your health care provider if you have any problems or questions after your procedure. What can I expect after the procedure? After your procedure, it is common:  To feel sleepy for several hours.  To feel clumsy and have poor balance for several hours.  To have poor judgment for several hours.  To vomit if you eat too soon.  Follow these instructions at home: For at least 24 hours after the procedure:   Do not: ? Participate in activities where you could fall or become injured. ? Drive. ? Use heavy machinery. ? Drink alcohol. ? Take sleeping pills or medicines that cause drowsiness. ? Make important decisions or sign legal documents. ? Take care of children on your own.  Rest. Eating and drinking  Follow the diet recommended by your health care provider.  If you vomit: ? Drink water, juice, or soup when you can drink without vomiting. ? Make sure you have little or no nausea before  eating solid foods. General instructions  Have a responsible adult stay with you until you are awake and alert.  Take over-the-counter and prescription medicines only as told by your health care provider.  If you smoke, do not smoke without supervision.  Keep all follow-up visits as told by your health care provider. This is important. Contact a health care provider if:  You keep feeling nauseous or you  keep vomiting.  You feel light-headed.  You develop a rash.  You have a fever. Get help right away if:  You have trouble breathing. This information is not intended to replace advice given to you by your health care provider. Make sure you discuss any questions you have with your health care provider. Document Released: 01/01/2013 Document Revised: 08/16/2015 Document Reviewed: 07/03/2015 Elsevier Interactive Patient Education  [employer].

## 2017-04-12 NOTE — Consult Note (Signed)
Chief Complaint: Patient was seen in consultation today for CT-guided bone marrow biopsy  Referring Physician(s): Perlov,Mikhail G  Supervising Physician: Marybelle Killings  Patient Status: [facility] - Out-pt  History of Present Illness: Nicholas Hernandez is a 72 y.o. male, former smoker,  with history of persistent erythrocytosis/elevated hemoglobin with positive DNMT3A mutation.  He presents today for CT-guided bone marrow biopsy to rule out MDS.  Past Medical History:  Diagnosis Date  . Allergic rhinitis   . CAD (coronary artery disease)    post 3.5 x 24m xience durg-eluting stent to distal right coronary artery  . Chronic LBP   . Esophageal stricture    status post dilatation 10-08-08  . Essential hypertension, benign   . External hemorrhoids   . GERD (gastroesophageal reflux disease)   . History of esophageal stricture   . HLD (hyperlipidemia)   . Impaired fasting glucose   . Shingles     Past Surgical History:  Procedure Laterality Date  . APPENDECTOMY    . right inguinal herniorrhaphy    . TONSILLECTOMY      Allergies: Bee venom and Codeine  Medications: Prior to Admission medications   Medication Sig Start Date End Date Taking? Authorizing Provider  amLODipine (NORVASC) 10 MG tablet Take 10 mg by mouth daily.     Yes [provider]  aspirin 81 MG tablet Take 81 mg by mouth daily.     Yes [provider]  cetirizine (ZYRTEC ALLERGY) 10 MG tablet Take 10 mg by mouth daily.     Yes [provider]  ezetimibe (ZETIA) 10 MG tablet Take 10 mg by mouth daily.   Yes [provider]  fluticasone (FLONASE) 50 MCG/ACT nasal spray HOLD RIGHT NOW   Yes [provider]  Ginkgo Biloba (GINKOBA) 40 MG TABS Take 40 mg by mouth daily.    Yes [provider]  LIPITOR 40 MG tablet TAKE ONE TABLET BY MOUTH DAILY. 09/25/11  Yes CSherren Mocha MD  OVER THE COUNTER MEDICATION Take 4 oz by mouth daily. Tart Cherry Juice   Yes [provider]  pantoprazole (PROTONIX) 40 MG tablet TAKE 1 TABLET BY MOUTH ONCE A DAY 03/10/13  Yes CSherren Mocha MD  Psyllium (METAMUCIL PO) Take 1 Dose by mouth 2 (two) times daily.    Yes [provider]  ZINC SULFATE PO Take 100 mg by mouth daily.    Yes [provider]  zolpidem (AMBIEN) 10 MG tablet Take 10 mg by mouth as needed (sleep).    Yes [provider]  nitroGLYCERIN (NITROSTAT) 0.4 MG SL tablet Place 0.4 mg under the tongue every 5 (five) minutes as needed for chest pain (chest pain).    [provider]  Omega-3 Fatty Acids (FISH OIL) 1200 MG CAPS Take 1 capsule by mouth 2 (two) times daily.     [provider]     Family History  Problem Relation Age of Onset  . Heart attack Father        MI at [phone] . Hypertension Father   . Diabetes Mellitus II Father   . Heart attack Paternal Grandfather   . Heart attack Maternal Grandfather   . Healthy Sister   . Healthy Daughter   . Healthy Daughter     Social History   Socioeconomic History  . Marital status: Married    Spouse name: None  . Number of children: None  . Years of education: None  . Highest education  level: None  Social Needs  . Financial resource strain: None  . Food insecurity - worry: None  . Food insecurity - inability: None  . Transportation needs - medical: None  . Transportation needs - non-medical: None  Occupational History  . None  Tobacco Use  . Smoking status: Former Research scientist (life sciences)  . Smokeless tobacco: Never Used  Substance and Sexual Activity  . Alcohol use: Yes    Alcohol/week: 0.0 oz    Comment: occas  . Drug use: No  . Sexual activity: None  Other Topics Concern  . None  Social History Narrative  . None      Review of Systems denies fever, chest pain, worsening dyspnea, cough, abdominal/back pain, nausea, vomiting or bleeding.  He does have occasional headaches.  Vital Signs: BP 128/77 (BP Location: Right Arm)   Pulse 74   Temp 98  F (36.7 C) (Oral)   Resp 18   SpO2 100%   Physical Exam awake, alert.  Chest clear to auscultation bilaterally.  Heart with regular rate and rhythm.  Abdomen soft, positive bowel sounds, nontender.  No lower extremity edema.  Imaging: No results found.  Labs:  CBC: Recent Labs    02/27/17 1222 04/12/17 0915  WBC 8.5 9.4  HGB 18.2* 18.0*  HCT 54.0* 53.1*  PLT 229 231    COAGS: Recent Labs    04/12/17 0915  INR 0.98    BMP: Recent Labs    02/27/17 1222  NA 141  K 4.0  CO2 26  GLUCOSE 109  BUN 14.5  CALCIUM 10.0  CREATININE 1.1    LIVER FUNCTION TESTS: Recent Labs    02/27/17 1222  BILITOT 1.83*  AST 27  ALT 23  ALKPHOS 137  PROT 8.0  ALBUMIN 4.4    TUMOR MARKERS: No results for input(s): AFPTM, CEA, CA199, CHROMGRNA in the last 8760 hours.  Assessment and Plan: 72 y.o. male, former smoker,  with history of persistent erythrocytosis/elevated hemoglobin with positive DNMT3A mutation.  He presents today for CT-guided bone marrow biopsy to rule out MDS. Risks and benefits discussed with the patient/daughter including, but not limited to bleeding, infection, damage to adjacent structures or low yield requiring additional tests.All of the patient's questions were answered, patient is agreeable to proceed. Consent signed and in chart.     Thank you for this interesting consult.  I greatly enjoyed meeting ALEXZAVIER GIRARDIN and look forward to participating in their care.  A copy of this report was sent to the requesting provider on this date.  Electronically Signed: D. Rowe Robert, PA-C 04/12/2017, 10:25 AM   I spent a total of 20 minutes  in face to face in clinical consultation, greater than 50% of which was counseling/coordinating care for CT-guided bone marrow biopsy

## 2017-04-12 NOTE — Procedures (Signed)
BM Bx  EBL 0 Comp 0 

## 2017-04-12 NOTE — Procedures (Signed)
IVC filter placement EBL 0 Comp 0 

## 2017-04-20 ENCOUNTER — Encounter: Payer: Self-pay | Admitting: Hematology and Oncology

## 2017-04-20 ENCOUNTER — Inpatient Hospital Stay: Payer: PPO

## 2017-04-20 ENCOUNTER — Other Ambulatory Visit: Payer: Self-pay

## 2017-04-20 ENCOUNTER — Inpatient Hospital Stay (HOSPITAL_BASED_OUTPATIENT_CLINIC_OR_DEPARTMENT_OTHER): Payer: PPO | Admitting: Hematology and Oncology

## 2017-04-20 ENCOUNTER — Telehealth: Payer: Self-pay | Admitting: Hematology and Oncology

## 2017-04-20 VITALS — BP 138/78 | HR 73 | Temp 97.8°F | Resp 17 | Ht 72.0 in | Wt 192.2 lb

## 2017-04-20 DIAGNOSIS — K222 Esophageal obstruction: Secondary | ICD-10-CM

## 2017-04-20 DIAGNOSIS — E785 Hyperlipidemia, unspecified: Secondary | ICD-10-CM | POA: Diagnosis not present

## 2017-04-20 DIAGNOSIS — R51 Headache: Secondary | ICD-10-CM | POA: Diagnosis not present

## 2017-04-20 DIAGNOSIS — I10 Essential (primary) hypertension: Secondary | ICD-10-CM

## 2017-04-20 DIAGNOSIS — D751 Secondary polycythemia: Secondary | ICD-10-CM | POA: Diagnosis not present

## 2017-04-20 DIAGNOSIS — I959 Hypotension, unspecified: Secondary | ICD-10-CM

## 2017-04-20 DIAGNOSIS — I2510 Atherosclerotic heart disease of native coronary artery without angina pectoris: Secondary | ICD-10-CM

## 2017-04-20 DIAGNOSIS — Z8619 Personal history of other infectious and parasitic diseases: Secondary | ICD-10-CM | POA: Diagnosis not present

## 2017-04-20 DIAGNOSIS — M7989 Other specified soft tissue disorders: Secondary | ICD-10-CM

## 2017-04-20 DIAGNOSIS — Z87891 Personal history of nicotine dependence: Secondary | ICD-10-CM | POA: Diagnosis not present

## 2017-04-20 DIAGNOSIS — Z7982 Long term (current) use of aspirin: Secondary | ICD-10-CM

## 2017-04-20 DIAGNOSIS — K644 Residual hemorrhoidal skin tags: Secondary | ICD-10-CM | POA: Diagnosis not present

## 2017-04-20 DIAGNOSIS — Z79899 Other long term (current) drug therapy: Secondary | ICD-10-CM

## 2017-04-20 DIAGNOSIS — K219 Gastro-esophageal reflux disease without esophagitis: Secondary | ICD-10-CM | POA: Diagnosis not present

## 2017-04-20 DIAGNOSIS — Z9049 Acquired absence of other specified parts of digestive tract: Secondary | ICD-10-CM

## 2017-04-20 NOTE — Progress Notes (Signed)
[facility] Follow-up Visit:  Assessment: Erythrocytosis 72 y.o. male with 20-pack-year history of smoking which he has quit in 1989.  Significantly elevated hemoglobin levels over the past 2 years at least with hematocrit exceeding 50.  No significant symptoms to suggest polycythemia at this time.  Platelet and white blood cell counts are normal.  Our evaluation demonstrated low normal liver [place], PFTs without evidence of COPD, and peripheral blood positive for a low level of DMMT3a mutation suggesting possible presence of myelodysplastic syndrome.  Plan:  -Consult interventional radiology for bone marrow biopsy to assess for morphological features of MDS. -Echocardiogram to evaluate left ventricular ejection fraction context of bilateral lower extremity swelling. - Return to clinic in 3 weeks to review findings of the bone marrow biopsy.  Voice recognition software was used and creation of this note. Despite my best effort at editing the text, some misspelling/errors may have occurred.  Orders Placed This Encounter  Procedures  . CT Biopsy    Standing Status:   Future    Number of Occurrences:   1    Standing Expiration Date:   04/06/2018    Order Specific Question:   Lab orders requested (DO NOT place separate lab orders, these will be automatically ordered during procedure specimen collection):    Answer:   Cytology - Non Pap    Comments:   Flow cytometry, cytogenetics, MDS-FISH    Order Specific Question:   Lab orders requested (DO NOT place separate lab orders, these will be automatically ordered during procedure specimen collection):    Answer:   Surgical Pathology    Order Specific Question:   Lab orders requested (DO NOT place separate lab orders, these will be automatically ordered during procedure specimen collection):    Answer:   Other    Order Specific Question:   Reason for Exam (SYMPTOM  OR DIAGNOSIS REQUIRED)    Answer:   Erythrocytosis with  positive DNMT3A mutation, please eval for underlying MDS    Order Specific Question:   Preferred imaging location?    Answer:   [facility]    Order Specific Question:   Radiology Contrast Protocol - do NOT remove file path    Answer:   \\[email or web address]  . CT BONE MARROW BIOPSY & ASPIRATION    Standing Status:   Future    Number of Occurrences:   1    Standing Expiration Date:   07/06/2018    Order Specific Question:   Reason for Exam (SYMPTOM  OR DIAGNOSIS REQUIRED)    Answer:   erythrocytosis    Order Specific Question:   Preferred imaging location?    Answer:   [facility]    Order Specific Question:   Radiology Contrast Protocol - do NOT remove file path    Answer:   \\[email or web address]    Cancer Staging No matching staging information was found for the patient.  All questions were answered.  . The patient knows to call the clinic with any problems, questions or concerns.  This note was electronically signed.    History of Presenting Illness Nicholas Hernandez is a 72 y.o. male followed in the [facility] for evaluation of moderate erythrocytosis, referred by Dr Marton Redwood.  Please see  the available hematological data below for details. Patient is a former smoker,  Quit in 1989 presently, reports no fevers, chills, night sweats.  Has been experiencing bilateral lower extremity swelling since August of this year..  Occasional headaches, but  none persisting.  No neurological deficits.  Patient denies any shortness of breath, chest pain, or cough.  No nausea, vomiting, abdominal pain, diarrhea, or constipation.  He reports easy bruising since coronary artery stent was placed and he has been on antiplatelet therapy.  He also reports having a reknot syndrome, but on additional questioning he does not have the full pattern of pallor or cyanosis on cold exposure followed by hyperemia that would characterize the  presentation.  No history of digital ulcers.  Oncological/hematological History: --Labs, 12/02/15: WBC 7.6, Hgb 17.5, Hct 53.8,         Plt 222; --Labs, 02/08/17: WBC 8.6, Hgb 18.8, Hct 55.1,         Plt 248; --Labs, 02/27/17: WBC 8.5, Hgb 18.2, Hct 54.0, MCV 94.9, MCH 32.0, Plt 229; Epo 4.7 --PFTs, 03/07/17: FEV1 94%, FVC 96%, FEV1/FVC 96%, no change with bronchodilator, DLCO 72% -- essentially normal findings --[facility]co[facility]ight, 03/09/17: Positive for DNMT3A C2644T mutation (9%) & negative for other mutations tested including JAK2, MPL, CALR   Medical History: Past Medical History:  Diagnosis Date  . Allergic rhinitis   . CAD (coronary artery disease)    post 3.5 x 44m xience durg-eluting stent to distal right coronary artery  . Chronic LBP   . Esophageal stricture    status post dilatation 10-08-08  . Essential hypertension, benign   . External hemorrhoids   . GERD (gastroesophageal reflux disease)   . History of esophageal stricture   . HLD (hyperlipidemia)   . Impaired fasting glucose   . Shingles     Surgical History: Past Surgical History:  Procedure Laterality Date  . APPENDECTOMY    . right inguinal herniorrhaphy    . TONSILLECTOMY      Family History: Family History  Problem Relation Age of Onset  . Heart attack Father        MI at [phone] . Hypertension Father   . Diabetes Mellitus II Father   . Heart attack Paternal Grandfather   . Heart attack Maternal Grandfather   . Healthy Sister   . Healthy Daughter   . Healthy Daughter     Social History: Social History   Socioeconomic History  . Marital status: Married    Spouse name: Not on file  . Number of children: Not on file  . Years of education: Not on file  . Highest education level: Not on file  Social Needs  . Financial resource strain: Not on file  . Food insecurity - worry: Not on file  . Food insecurity - inability: Not on file  . Transportation needs - medical: Not on file  . Transportation  needs - non-medical: Not on file  Occupational History  . Not on file  Tobacco Use  . Smoking status: Former SResearch scientist (life sciences) . Smokeless tobacco: Never Used  Substance and Sexual Activity  . Alcohol use: Yes    Alcohol/week: 0.0 oz    Comment: occas  . Drug use: No  . Sexual activity: Not on file  Other Topics Concern  . Not on file  Social History Narrative  . Not on file    Allergies: Allergies  Allergen Reactions  . Bee Venom Swelling  . Codeine Nausea And Vomiting    Medications:  Current Outpatient Medications  Medication Sig Dispense Refill  . amLODipine (NORVASC) 10 MG tablet Take 10 mg by mouth daily.      .Marland Kitchenaspirin 81 MG tablet Take 81 mg by mouth daily.      .Marland Kitchen  cetirizine (ZYRTEC ALLERGY) 10 MG tablet Take 10 mg by mouth daily.      Marland Kitchen ezetimibe (ZETIA) 10 MG tablet Take 10 mg by mouth daily.    . fluticasone (FLONASE) 50 MCG/ACT nasal spray HOLD RIGHT NOW    . Ginkgo Biloba (GINKOBA) 40 MG TABS Take 40 mg by mouth daily.     Marland Kitchen LIPITOR 40 MG tablet TAKE ONE TABLET BY MOUTH DAILY. 30 tablet 2  . nitroGLYCERIN (NITROSTAT) 0.4 MG SL tablet Place 0.4 mg under the tongue every 5 (five) minutes as needed for chest pain (chest pain).    Marland Kitchen OVER THE COUNTER MEDICATION Take 4 oz by mouth daily. Tart Cherry Juice    . pantoprazole (PROTONIX) 40 MG tablet TAKE 1 TABLET BY MOUTH ONCE A DAY 30 tablet 5  . Psyllium (METAMUCIL PO) Take 1 Dose by mouth 2 (two) times daily.     Marland Kitchen ZINC SULFATE PO Take 100 mg by mouth daily.     Marland Kitchen zolpidem (AMBIEN) 10 MG tablet Take 10 mg by mouth as needed (sleep).     . cherry syrup syrup Take 4 mLs by mouth once.     No current facility-administered medications for this visit.     Review of Systems: Review of Systems  All other systems reviewed and are negative.    PHYSICAL EXAMINATION There were no vitals taken for this visit.  ECOG PERFORMANCE STATUS: 0 - Asymptomatic  Physical Exam  Constitutional: He is oriented to person, place, and time  and well-developed, well-nourished, and in no distress. No distress.  HENT:  Head: Normocephalic and atraumatic.  Mouth/Throat: Oropharynx is clear and moist. No oropharyngeal exudate.  Eyes: Conjunctivae and EOM are normal. Pupils are equal, round, and reactive to light. No scleral icterus.  Neck: No thyromegaly present.  Cardiovascular: Normal rate, regular rhythm, normal heart sounds and intact distal pulses.  No murmur heard. Pulmonary/Chest: Effort normal and breath sounds normal. No respiratory distress. He has no wheezes. He has no rales.  Abdominal: Soft. Bowel sounds are normal. He exhibits no distension. There is no tenderness. There is no rebound and no guarding.  Musculoskeletal: He exhibits edema.  Lymphadenopathy:    He has no cervical adenopathy.  Neurological: He is alert and oriented to person, place, and time. He has normal reflexes. No cranial nerve deficit.  Skin: Skin is warm. No rash noted. He is not diaphoretic. No erythema. No pallor.     LABORATORY DATA: I have personally reviewed the data as listed: No visits with results within 1 Week(s) from this visit.  Latest known visit with results is:  Hospital Outpatient Visit on 03/07/2017  Component Date Value Ref Range Status  . FVC-Pre 03/07/2017 4.43  L Final  . FVC-%Pred-Pre 03/07/2017 96  % Final  . FVC-Post 03/07/2017 4.31  L Final  . FVC-%Pred-Post 03/07/2017 94  % Final  . FVC-%Change-Post 03/07/2017 -2  % Final  . FEV1-Pre 03/07/2017 3.18  L Final  . FEV1-%Pred-Pre 03/07/2017 94  % Final  . FEV1-Post 03/07/2017 3.14  L Final  . FEV1-%Pred-Post 03/07/2017 93  % Final  . FEV1-%Change-Post 03/07/2017 -1  % Final  . FEV6-Pre 03/07/2017 4.27  L Final  . FEV6-%Pred-Pre 03/07/2017 98  % Final  . FEV6-Post 03/07/2017 4.16  L Final  . FEV6-%Pred-Post 03/07/2017 96  % Final  . FEV6-%Change-Post 03/07/2017 -2  % Final  . Pre FEV1/FVC ratio 03/07/2017 72  % Final  . FEV1FVC-%Pred-Pre 03/07/2017 96  % Final  .  Post FEV1/FVC ratio 03/07/2017 73  % Final  . FEV1FVC-%Change-Post 03/07/2017 1  % Final  . Pre FEV6/FVC Ratio 03/07/2017 96  % Final  . FEV6FVC-%Pred-Pre 03/07/2017 101  % Final  . Post FEV6/FVC ratio 03/07/2017 96  % Final  . FEV6FVC-%Pred-Post 03/07/2017 101  % Final  . FEV6FVC-%Change-Post 03/07/2017 0  % Final  . FEF 25-75 Pre 03/07/2017 2.19  L/sec Final  . FEF2575-%Pred-Pre 03/07/2017 85  % Final  . FEF 25-75 Post 03/07/2017 2.00  L/sec Final  . FEF2575-%Pred-Post 03/07/2017 78  % Final  . FEF2575-%Change-Post 03/07/2017 -8  % Final  . RV 03/07/2017 4.25  L Final  . RV % pred 03/07/2017 169  % Final  . TLC 03/07/2017 8.56  L Final  . TLC % pred 03/07/2017 118  % Final  . DLCO unc 03/07/2017 26.18  ml/min/mmHg Final  . DLCO unc % pred 03/07/2017 77  % Final  . DLCO cor 03/07/2017 24.40  ml/min/mmHg Final  . DLCO cor % pred 03/07/2017 72  % Final  . DL/VA 03/07/2017 3.92  ml/min/mmHg/L Final  . DL/VA % pred 03/07/2017 84  % Final  . FIO2 03/07/2017 21.00   Final  . Delivery systems 03/07/2017 ROOM AIR   Final  . pH, Arterial 03/07/2017 7.409  7.350 - 7.450 Final  . pCO2 arterial 03/07/2017 37.1  32.0 - 48.0 mmHg Final  . pO2, Arterial 03/07/2017 87.4  83.0 - 108.0 mmHg Final  . Bicarbonate 03/07/2017 23.0  20.0 - 28.0 mmol/L Final  . Acid-base deficit 03/07/2017 0.7  0.0 - 2.0 mmol/L Final  . O2 Saturation 03/07/2017 96.4  % Final  . Patient temperature 03/07/2017 98.6   Final  . Collection site 03/07/2017 RIGHT BRACHIAL   Final  . Drawn by 03/07/2017 [id number]   Final  . Sample type 03/07/2017 ARTERIAL DRAW   Final  . Chauncey Reading test (pass/fail) 03/07/2017 PASS  PASS Final       Ardath Sax, MD

## 2017-04-20 NOTE — Telephone Encounter (Signed)
Gave avs and calendar for april °

## 2017-04-20 NOTE — Assessment & Plan Note (Signed)
72 y.o. male with 20-pack-year history of smoking which he has quit in 1989.  Significantly elevated hemoglobin levels over the past 2 years at least with hematocrit exceeding 50.  No significant symptoms to suggest polycythemia at this time.  Platelet and white blood cell counts are normal.  Our evaluation demonstrated low normal liver [place], PFTs without evidence of COPD, and peripheral blood positive for a low level of DMMT3a mutation suggesting possible presence of myelodysplastic syndrome.  Plan:  -Consult interventional radiology for bone marrow biopsy to assess for morphological features of MDS. -Echocardiogram to evaluate left ventricular ejection fraction context of bilateral lower extremity swelling. - Return to clinic in 3 weeks to review findings of the bone marrow biopsy.

## 2017-04-25 DIAGNOSIS — J309 Allergic rhinitis, unspecified: Secondary | ICD-10-CM | POA: Diagnosis not present

## 2017-04-25 DIAGNOSIS — D751 Secondary polycythemia: Secondary | ICD-10-CM | POA: Diagnosis not present

## 2017-04-25 DIAGNOSIS — R0609 Other forms of dyspnea: Secondary | ICD-10-CM | POA: Diagnosis not present

## 2017-04-25 DIAGNOSIS — I10 Essential (primary) hypertension: Secondary | ICD-10-CM | POA: Diagnosis not present

## 2017-04-25 DIAGNOSIS — J069 Acute upper respiratory infection, unspecified: Secondary | ICD-10-CM | POA: Diagnosis not present

## 2017-04-25 DIAGNOSIS — R601 Generalized edema: Secondary | ICD-10-CM | POA: Diagnosis not present

## 2017-04-26 ENCOUNTER — Other Ambulatory Visit: Payer: Self-pay | Admitting: Internal Medicine

## 2017-04-26 DIAGNOSIS — R609 Edema, unspecified: Secondary | ICD-10-CM

## 2017-04-26 DIAGNOSIS — R0609 Other forms of dyspnea: Secondary | ICD-10-CM

## 2017-05-07 ENCOUNTER — Other Ambulatory Visit: Payer: Self-pay

## 2017-05-07 ENCOUNTER — Ambulatory Visit (HOSPITAL_COMMUNITY): Payer: PPO | Attending: Cardiology

## 2017-05-07 DIAGNOSIS — I2510 Atherosclerotic heart disease of native coronary artery without angina pectoris: Secondary | ICD-10-CM | POA: Diagnosis not present

## 2017-05-07 DIAGNOSIS — E785 Hyperlipidemia, unspecified: Secondary | ICD-10-CM | POA: Diagnosis not present

## 2017-05-07 DIAGNOSIS — R609 Edema, unspecified: Secondary | ICD-10-CM

## 2017-05-07 DIAGNOSIS — I348 Other nonrheumatic mitral valve disorders: Secondary | ICD-10-CM | POA: Diagnosis not present

## 2017-05-07 DIAGNOSIS — R0609 Other forms of dyspnea: Secondary | ICD-10-CM

## 2017-05-07 LAB — ECHOCARDIOGRAM COMPLETE
CHL CUP MV DEC (S): 366
CHL CUP RV SYS PRESS: 14 mmHg
E decel time: 366 msec
EERAT: 10.76
FS: 37 % (ref 28–44)
IVS/LV PW RATIO, ED: 0.92
LA diam end sys: 36 mm
LA vol index: 21.7 mL/m2
LADIAMINDEX: 1.7 cm/m2
LASIZE: 36 mm
LAVOL: 45.9 mL
LAVOLA4C: 39.7 mL
LV TDI E'LATERAL: 6.31
LVEEAVG: 10.76
LVEEMED: 10.76
LVELAT: 6.31 cm/s
LVOT VTI: 26.2 cm
LVOT area: 3.14 cm2
LVOT peak grad rest: 5 mmHg
LVOT peak vel: 112 cm/s
LVOTD: 20 mm
LVOTSV: 82 mL
MV pk A vel: 82.5 m/s
MVPKEVEL: 67.9 m/s
PW: 12 mm — AB (ref 0.6–1.1)
RV LATERAL S' VELOCITY: 11 cm/s
Reg peak vel: 167 cm/s
TAPSE: 19.3 mm
TDI e' medial: 6.53
TR max vel: 167 cm/s
TVPG: 167 mmHg

## 2017-05-08 DIAGNOSIS — H5213 Myopia, bilateral: Secondary | ICD-10-CM | POA: Diagnosis not present

## 2017-05-09 NOTE — Assessment & Plan Note (Signed)
72 y.o. male with 20-pack-year history of smoking which he has quit in 1989.  Significantly elevated hemoglobin levels over the past 2 years at least with hematocrit exceeding 50.  No significant symptoms to attributable to t polycythemia at this time.  Platelet and white blood cell counts are normal. Our evaluation demonstrated low normal liver function tests, PFTs without evidence of COPD, and peripheral blood positive for a low level of DMMT3a mutation suggesting possible presence of myelodysplastic syndrome.  Nevertheless, morphological evaluation of the bone marrow sample showed no evidence of MPN/MDS.  With that in mind, I cannot confirm the diagnosis of myeloproliferative neoplasm and will treat patient is a secondary polycythemia unless cytogenetic or molecular studies from the bone marrow returned back positive.  We may be dealing with an early form of the disorder considering only 9% involvement with the mutation outlined above.  Plan:  -Initiate observation. -Return to clinic in 3 months: Labs, clinic visit, possible therapeutic phlebotomy if hematocrit exceeds 55%.

## 2017-05-09 NOTE — Progress Notes (Signed)
[facility] Follow-up Visit:  Assessment: Erythrocytosis 72 y.o. male with 20-pack-year history of smoking which he has quit in 1989.  Significantly elevated hemoglobin levels over the past 2 years at least with hematocrit exceeding 50.  No significant symptoms to attributable to t polycythemia at this time.  Platelet and white blood cell counts are normal. Our evaluation demonstrated low normal liver function tests, PFTs without evidence of COPD, and peripheral blood positive for a low level of DMMT3a mutation suggesting possible presence of myelodysplastic syndrome.  Nevertheless, morphological evaluation of the bone marrow sample showed no evidence of MPN/MDS.  With that in mind, I cannot confirm the diagnosis of myeloproliferative neoplasm and will treat patient is a secondary polycythemia unless cytogenetic or molecular studies from the bone marrow returned back positive.  We may be dealing with an early form of the disorder considering only 9% involvement with the mutation outlined above.  Plan:  -Initiate observation. -Return to clinic in 3 months: Labs, clinic visit, possible therapeutic phlebotomy if hematocrit exceeds 55%.  Voice recognition software was used and creation of this note. Despite my best effort at editing the text, some misspelling/errors may have occurred.  Orders Placed This Encounter  Procedures  . CBC with Differential (Cancer Center Only)    Standing Status:   Future    Standing Expiration Date:   04/20/2018  . CMP ([facility] only)    Standing Status:   Future    Standing Expiration Date:   04/20/2018  . Lactate dehydrogenase (LDH)    Standing Status:   Future    Standing Expiration Date:   04/20/2018    Cancer Staging No matching staging information was found for the patient.  All questions were answered.  . The patient knows to call the clinic with any problems, questions or concerns.  This note was electronically signed.     History of Presenting Illness Nicholas Hernandez is a 72 y.o. male followed in the [facility] for evaluation of moderate erythrocytosis, referred by Dr Marton Redwood.  Please see  the available hematological data below for details. Patient is a former smoker,  Quit in 1989 presently, reports no fevers, chills, night sweats.  Has been experiencing bilateral lower extremity swelling since August of this year..  Occasional headaches, but none persisting.  No neurological deficits.  Patient denies any shortness of breath, chest pain, or cough.  No nausea, vomiting, abdominal pain, diarrhea, or constipation.  He reports easy bruising since coronary artery stent was placed and he has been on antiplatelet therapy.  He also reports having a reknot syndrome, but on additional questioning he does not have the full pattern of pallor or cyanosis on cold exposure followed by hyperemia that would characterize the presentation.  No history of digital ulcers.  Patient denies any new symptoms since last visit to the clinic.  Oncological/hematological History: --Labs, 12/02/15: WBC 7.6, Hgb 17.5, Hct 53.8,         Plt 222; --Labs, 02/08/17: WBC 8.6, Hgb 18.8, Hct 55.1,         Plt 248; --Labs, 02/27/17: WBC 8.5, Hgb 18.2, Hct 54.0, MCV 94.9, MCH 32.0, Plt 229; Epo 4.7 --PFTs, 03/07/17: FEV1 94%, FVC 96%, FEV1/FVC 96%, no change with bronchodilator, DLCO 72% -- essentially normal findings --[facility]ight, 03/09/17: Positive for DNMT3A C2644T mutation (9%) & negative for other mutations tested including JAK2, MPL, CALR --Labs, 04/12/17: WBC 9.4, Hgb 18.0, Hct 53.1, MCV 93.5, MCH 31.7, Plt 231;  --BM Bx,  04/12/17: Normocellular bone marrow with trilineage hematopoiesis negative for morphological evidence of myeloproliferative neoplasm or myelodysplastic syndrome. Cytogenetic and FISH analysis pending  Medical History: Past Medical History:  Diagnosis Date  . Allergic rhinitis   . CAD (coronary artery disease)    post  3.5 x 71mm xience durg-eluting stent to distal right coronary artery  . Chronic LBP   . Esophageal stricture    status post dilatation 10-08-08  . Essential hypertension, benign   . External hemorrhoids   . GERD (gastroesophageal reflux disease)   . History of esophageal stricture   . HLD (hyperlipidemia)   . Impaired fasting glucose   . Shingles     Surgical History: Past Surgical History:  Procedure Laterality Date  . APPENDECTOMY    . right inguinal herniorrhaphy    . TONSILLECTOMY      Family History: Family History  Problem Relation Age of Onset  . Heart attack Father        MI at 15, 10  . Hypertension Father   . Diabetes Mellitus II Father   . Heart attack Paternal Grandfather   . Heart attack Maternal Grandfather   . Healthy Sister   . Healthy Daughter   . Healthy Daughter     Social History: Social History   Socioeconomic History  . Marital status: Married    Spouse name: Not on file  . Number of children: Not on file  . Years of education: Not on file  . Highest education level: Not on file  Social Needs  . Financial resource strain: Not on file  . Food insecurity - worry: Not on file  . Food insecurity - inability: Not on file  . Transportation needs - medical: Not on file  . Transportation needs - non-medical: Not on file  Occupational History  . Not on file  Tobacco Use  . Smoking status: Former Research scientist (life sciences)  . Smokeless tobacco: Never Used  Substance and Sexual Activity  . Alcohol use: Yes    Alcohol/week: 0.0 oz    Comment: occas  . Drug use: No  . Sexual activity: Not on file  Other Topics Concern  . Not on file  Social History Narrative  . Not on file    Allergies: Allergies  Allergen Reactions  . Bee Venom Swelling  . Codeine Nausea And Vomiting    Medications:  Current Outpatient Medications  Medication Sig Dispense Refill  . cherry syrup syrup Take 4 mLs by mouth once.    Marland Kitchen amLODipine (NORVASC) 10 MG tablet Take 10 mg by mouth  daily.      Marland Kitchen aspirin 81 MG tablet Take 81 mg by mouth daily.      . cetirizine (ZYRTEC ALLERGY) 10 MG tablet Take 10 mg by mouth daily.      Marland Kitchen ezetimibe (ZETIA) 10 MG tablet Take 10 mg by mouth daily.    . fluticasone (FLONASE) 50 MCG/ACT nasal spray HOLD RIGHT NOW    . Ginkgo Biloba (GINKOBA) 40 MG TABS Take 40 mg by mouth daily.     Marland Kitchen LIPITOR 40 MG tablet TAKE ONE TABLET BY MOUTH DAILY. 30 tablet 2  . nitroGLYCERIN (NITROSTAT) 0.4 MG SL tablet Place 0.4 mg under the tongue every 5 (five) minutes as needed for chest pain (chest pain).    Marland Kitchen OVER THE COUNTER MEDICATION Take 4 oz by mouth daily. Tart Cherry Juice    . pantoprazole (PROTONIX) 40 MG tablet TAKE 1 TABLET BY MOUTH ONCE A DAY 30 tablet  5  . Psyllium (METAMUCIL PO) Take 1 Dose by mouth 2 (two) times daily.     Marland Kitchen ZINC SULFATE PO Take 100 mg by mouth daily.     Marland Kitchen zolpidem (AMBIEN) 10 MG tablet Take 10 mg by mouth as needed (sleep).      No current facility-administered medications for this visit.     Review of Systems: Review of Systems  All other systems reviewed and are negative.    PHYSICAL EXAMINATION Blood pressure 138/78, pulse 73, temperature 97.8 F (36.6 C), temperature source Oral, resp. rate 17, height 6' (1.829 m), weight 192 lb 3.2 oz (87.2 kg), SpO2 99 %.  ECOG PERFORMANCE STATUS: 0 - Asymptomatic  Physical Exam  Constitutional: He is oriented to person, place, and time and well-developed, well-nourished, and in no distress. No distress.  HENT:  Head: Normocephalic and atraumatic.  Mouth/Throat: Oropharynx is clear and moist. No oropharyngeal exudate.  Eyes: Conjunctivae and EOM are normal. Pupils are equal, round, and reactive to light. No scleral icterus.  Neck: No thyromegaly present.  Cardiovascular: Normal rate, regular rhythm, normal heart sounds and intact distal pulses.  No murmur heard. Pulmonary/Chest: Effort normal and breath sounds normal. No respiratory distress. He has no wheezes. He has no  rales.  Abdominal: Soft. Bowel sounds are normal. He exhibits no distension. There is no tenderness. There is no rebound and no guarding.  Musculoskeletal: He exhibits edema.  Lymphadenopathy:    He has no cervical adenopathy.  Neurological: He is alert and oriented to person, place, and time. He has normal reflexes. No cranial nerve deficit.  Skin: Skin is warm. No rash noted. He is not diaphoretic. No erythema. No pallor.     LABORATORY DATA: I have personally reviewed the data as listed: No visits with results within 1 Week(s) from this visit.  Latest known visit with results is:  Hospital Outpatient Visit on 04/12/2017  Component Date Value Ref Range Status  . WBC 04/12/2017 9.4  4.0 - 10.5 K/uL Final  . RBC 04/12/2017 5.68  4.22 - 5.81 MIL/uL Final  . Hemoglobin 04/12/2017 18.0* 13.0 - 17.0 g/dL Final  . HCT 04/12/2017 53.1* 39.0 - 52.0 % Final  . MCV 04/12/2017 93.5  78.0 - 100.0 fL Final  . MCH 04/12/2017 31.7  26.0 - 34.0 pg Final  . MCHC 04/12/2017 33.9  30.0 - 36.0 g/dL Final  . RDW 04/12/2017 13.7  11.5 - 15.5 % Final  . Platelets 04/12/2017 231  150 - 400 K/uL Final  . Neutrophils Relative % 04/12/2017 64  % Final  . Neutro Abs 04/12/2017 6.1  1.7 - 7.7 K/uL Final  . Lymphocytes Relative 04/12/2017 23  % Final  . Lymphs Abs 04/12/2017 2.1  0.7 - 4.0 K/uL Final  . Monocytes Relative 04/12/2017 8  % Final  . Monocytes Absolute 04/12/2017 0.8  0.1 - 1.0 K/uL Final  . Eosinophils Relative 04/12/2017 4  % Final  . Eosinophils Absolute 04/12/2017 0.3  0.0 - 0.7 K/uL Final  . Basophils Relative 04/12/2017 1  % Final  . Basophils Absolute 04/12/2017 0.1  0.0 - 0.1 K/uL Final  . Prothrombin Time 04/12/2017 12.9  11.4 - 15.2 seconds Final  . INR 04/12/2017 0.98   Final       Ardath Sax, MD

## 2017-05-10 DIAGNOSIS — G4734 Idiopathic sleep related nonobstructive alveolar hypoventilation: Secondary | ICD-10-CM | POA: Diagnosis not present

## 2017-05-15 ENCOUNTER — Encounter (HOSPITAL_COMMUNITY): Payer: Self-pay

## 2017-05-15 DIAGNOSIS — G4734 Idiopathic sleep related nonobstructive alveolar hypoventilation: Secondary | ICD-10-CM | POA: Diagnosis not present

## 2017-05-18 LAB — CHROMOSOME ANALYSIS, BONE MARROW

## 2017-05-18 LAB — TISSUE HYBRIDIZATION (BONE MARROW)-NCBH

## 2017-05-23 DIAGNOSIS — I2510 Atherosclerotic heart disease of native coronary artery without angina pectoris: Secondary | ICD-10-CM | POA: Diagnosis not present

## 2017-06-20 ENCOUNTER — Ambulatory Visit (INDEPENDENT_AMBULATORY_CARE_PROVIDER_SITE_OTHER): Payer: PPO | Admitting: Neurology

## 2017-06-20 ENCOUNTER — Encounter: Payer: Self-pay | Admitting: Neurology

## 2017-06-20 VITALS — BP 145/83 | HR 84 | Ht 71.0 in | Wt 192.0 lb

## 2017-06-20 DIAGNOSIS — E663 Overweight: Secondary | ICD-10-CM | POA: Diagnosis not present

## 2017-06-20 DIAGNOSIS — Z955 Presence of coronary angioplasty implant and graft: Secondary | ICD-10-CM | POA: Diagnosis not present

## 2017-06-20 DIAGNOSIS — R351 Nocturia: Secondary | ICD-10-CM | POA: Diagnosis not present

## 2017-06-20 DIAGNOSIS — R7981 Abnormal blood-gas level: Secondary | ICD-10-CM | POA: Diagnosis not present

## 2017-06-20 DIAGNOSIS — R0602 Shortness of breath: Secondary | ICD-10-CM

## 2017-06-20 DIAGNOSIS — I2510 Atherosclerotic heart disease of native coronary artery without angina pectoris: Secondary | ICD-10-CM | POA: Diagnosis not present

## 2017-06-20 DIAGNOSIS — R519 Headache, unspecified: Secondary | ICD-10-CM

## 2017-06-20 DIAGNOSIS — D751 Secondary polycythemia: Secondary | ICD-10-CM

## 2017-06-20 DIAGNOSIS — G4734 Idiopathic sleep related nonobstructive alveolar hypoventilation: Secondary | ICD-10-CM | POA: Diagnosis not present

## 2017-06-20 DIAGNOSIS — R51 Headache: Secondary | ICD-10-CM

## 2017-06-20 NOTE — Patient Instructions (Addendum)

## 2017-06-20 NOTE — Progress Notes (Signed)
Subjective:    Patient ID: Nicholas Hernandez is a 72 y.o. male.  HPI     Star Age, MD, PhD [facility] [address] [place],  [address]  Dear Dr. Brigitte Pulse,   I saw your patient, Nicholas Hernandez, upon your kind request in my neurologic clinic today for initial consultation of her sleep disorder, in particular, concern for underlying obstructive sleep apnea. The patient is accompanied by his wife today. As you know, Nicholas Hernandez is a 72 year old right-handed gentleman with an underlying medical history of erythrocytosis, prior smoking, allergic rhinitis, coronary artery disease with status post stent placement, chronic low back pain, hypertension, reflux disease, esophageal stricture status post dilatation, hyperlipidemia, impaired fasting glucose, history of shingles and overweight state, who reports a recent overnight pulse oximetry test showed oxygen desaturations. We will request those test results from your office. He has no loud snoring, per wife, but reports sleep disruption, nonrestorative sleep, no telltale significant sleepiness during the day but certainly does not wake up rested typically. He has been on supplemental oxygen for the past 3 weeks or so since he had a abnormal finding on the pulse oximetry test. He feels that perhaps the oxygen has helped, at 2 L/m via oxygen concentrator. He has noticed less interruption of his sleep and less nocturia. He currently has nocturia once per average night but used to get up more than that. He takes Ambien very cautiously, usually 5 mg as needed, maybe once a month or so. He is not aware of any family history of OSA. He had tonsillectomy at age 47. He has had some morning headaches which are dull and achy not severe. I reviewed your office note from 04/25/2017, which you kindly included. His Epworth sleepiness score is 1 out of 24, fatigue score is 12 out of 63. He is married and lives with his wife. They  have 2 children. He stopped smoking in 1989. He drinks alcohol the form of beer, typically 2 per day and drinks caffeine in the form of coffee, 4 cups per day on average.   His Past Medical History Is Significant For: Past Medical History:  Diagnosis Date  . Allergic rhinitis   . CAD (coronary artery disease)    post 3.5 x 37mm xience durg-eluting stent to distal right coronary artery  . Chronic LBP   . Esophageal stricture    status post dilatation 10-08-08  . Essential hypertension, benign   . External hemorrhoids   . GERD (gastroesophageal reflux disease)   . History of esophageal stricture   . HLD (hyperlipidemia)   . Impaired fasting glucose   . Shingles     His Past Surgical History Is Significant For: Past Surgical History:  Procedure Laterality Date  . APPENDECTOMY    . right inguinal herniorrhaphy    . TONSILLECTOMY      His Family History Is Significant For: Family History  Problem Relation Age of Onset  . Heart attack Father        MI at 65, 70  . Hypertension Father   . Diabetes Mellitus II Father   . Heart attack Paternal Grandfather   . Heart attack Maternal Grandfather   . Healthy Sister   . Healthy Daughter   . Healthy Daughter     His Social History Is Significant For: Social History   Socioeconomic History  . Marital status: Married    Spouse name: Not on file  . Number of children: Not  on file  . Years of education: Not on file  . Highest education level: Not on file  Occupational History  . Not on file  Social Needs  . Financial resource strain: Not on file  . Food insecurity:    Worry: Not on file    Inability: Not on file  . Transportation needs:    Medical: Not on file    Non-medical: Not on file  Tobacco Use  . Smoking status: Former Research scientist (life sciences)  . Smokeless tobacco: Never Used  Substance and Sexual Activity  . Alcohol use: Yes    Alcohol/week: 0.0 oz    Comment: occas  . Drug use: No  . Sexual activity: Not on file  Lifestyle   . Physical activity:    Days per week: Not on file    Minutes per session: Not on file  . Stress: Not on file  Relationships  . Social connections:    Talks on phone: Not on file    Gets together: Not on file    Attends religious service: Not on file    Active member of club or organization: Not on file    Attends meetings of clubs or organizations: Not on file    Relationship status: Not on file  Other Topics Concern  . Not on file  Social History Narrative  . Not on file    His Allergies Are:  Allergies  Allergen Reactions  . Bee Venom Swelling  . Codeine Nausea And Vomiting  :   His Current Medications Are:  Outpatient Encounter Medications as of 06/20/2017  Medication Sig  . amLODipine (NORVASC) 5 MG tablet Take 5 mg by mouth daily.  Marland Kitchen aspirin 81 MG tablet Take 81 mg by mouth daily.    Marland Kitchen ezetimibe (ZETIA) 10 MG tablet Take 10 mg by mouth daily.  . fluticasone (FLONASE) 50 MCG/ACT nasal spray HOLD RIGHT NOW  . Ginkgo Biloba (GINKOBA) 40 MG TABS Take 40 mg by mouth daily.   Marland Kitchen levocetirizine (XYZAL) 5 MG tablet Take 5 mg by mouth every evening.  Marland Kitchen LIPITOR 40 MG tablet TAKE ONE TABLET BY MOUTH DAILY.  . nitroGLYCERIN (NITROSTAT) 0.4 MG SL tablet Place 0.4 mg under the tongue every 5 (five) minutes as needed for chest pain (chest pain).  Marland Kitchen OVER THE COUNTER MEDICATION Take 4 oz by mouth daily. Tart Cherry Juice  . OVER THE COUNTER MEDICATION Tart Cherry Juice  . pantoprazole (PROTONIX) 40 MG tablet TAKE 1 TABLET BY MOUTH ONCE A DAY  . Psyllium (METAMUCIL PO) Take 1 Dose by mouth 2 (two) times daily.   Marland Kitchen ZINC SULFATE PO Take 100 mg by mouth daily.   Marland Kitchen zolpidem (AMBIEN) 10 MG tablet Take 10 mg by mouth as needed (sleep).   . [DISCONTINUED] amLODipine (NORVASC) 10 MG tablet Take 10 mg by mouth daily.    . [DISCONTINUED] cetirizine (ZYRTEC ALLERGY) 10 MG tablet Take 10 mg by mouth daily.    . [DISCONTINUED] cherry syrup syrup Take 4 mLs by mouth once.   No  facility-administered encounter medications on file as of 06/20/2017.   :  Review of Systems:  Out of a complete 14 point review of systems, all are reviewed and negative with the exception of these symptoms as listed below:  Review of Systems  Neurological:       Pt presents today to discuss his sleep. Pt has told that he has high hemoglobin. An overnight oxygen test was performed and his O2 dropped. Pt is  using nocturnal oxygen. Pt does not snore.  Epworth Sleepiness Scale 0= would never doze 1= slight chance of dozing 2= moderate chance of dozing 3= high chance of dozing  Sitting and reading: 1 Watching TV: 0 Sitting inactive in a public place (ex. Theater or meeting): 0 As a passenger in a car for an hour without a break: 0 Lying down to rest in the afternoon: 0 Sitting and talking to someone: 0 Sitting quietly after lunch (no alcohol): 0 In a car, while stopped in traffic: 0 Total: 1    Objective:  Neurological Exam  Physical Exam Physical Examination:   Vitals:   06/20/17 1527  BP: (!) 145/83  Pulse: 84    General Examination: The patient is a very pleasant 72 y.o. male in no acute distress. He appears well-developed and well-nourished and well groomed.   HEENT: Normocephalic, atraumatic, pupils are equal, round and reactive to light and accommodation. Funduscopic exam is normal with sharp disc margins noted. Extraocular tracking is good without limitation to gaze excursion or nystagmus noted. Normal smooth pursuit is noted. Hearing is grossly intact. Tympanic membranes are clear bilaterally. Face is symmetric with normal facial animation and normal facial sensation. Speech is clear with no dysarthria noted. There is no hypophonia. There is no lip, neck/head, jaw or voice tremor. Neck is supple with full range of passive and active motion. There are no carotid bruits on auscultation. Oropharynx exam reveals: mild mouth dryness, adequate dental hygiene and mild airway  crowding, due to smaller airway entry and redundant soft palate. Tonsils are absent. Mallampati is class II. Neck circumference is 16 inches. He has a mild overbite. Tongue protrudes centrally and palate elevates symmetrically.   Chest: Clear to auscultation without wheezing, rhonchi or crackles noted.  Heart: S1+S2+0, regular and normal without murmurs, rubs or gallops noted.   Abdomen: Soft, non-tender and non-distended with normal bowel sounds appreciated on auscultation.  Extremities: There is no pitting edema in the distal lower extremities bilaterally. Pedal pulses are intact.  Skin: Warm and dry without trophic changes noted.  Musculoskeletal: exam reveals no obvious joint deformities, tenderness or joint swelling or erythema.   Neurologically:  Mental status: The patient is awake, alert and oriented in all 4 spheres. His immediate and remote memory, attention, language skills and fund of knowledge are appropriate. There is no evidence of aphasia, agnosia, apraxia or anomia. Speech is clear with normal prosody and enunciation. Thought process is linear. Mood is normal and affect is normal.  Cranial nerves II - XII are as described above under HEENT exam. In addition: shoulder shrug is normal with equal shoulder height noted. Motor exam: Normal bulk, strength and tone is noted. There is no drift, tremor or rebound. Romberg is negative. Fine motor skills and coordination: grossly intact.  Cerebellar testing: No dysmetria or intention tremor on finger to nose testing. Heel to shin is unremarkable bilaterally. There is no truncal or gait ataxia.  Sensory exam: intact to light touch in the upper and lower extremities.  Gait, station and balance: He stands easily. No veering to one side is noted. No leaning to one side is noted. Posture is age-appropriate and stance is narrow based. Gait shows normal stride length and normal pace. No problems turning are noted. Tandem walk is unremarkable.    Assessment and Plan:   In summary, Nicholas Hernandez is a very pleasant 72 y.o.-year old male with an underlying medical history of erythrocytosis, prior smoking, allergic rhinitis, coronary artery disease  with status post stent placement, chronic low back pain, hypertension, reflux disease, esophageal stricture status post dilatation, hyperlipidemia, impaired fasting glucose, history of shingles and overweight state, whose history and physical exam are concerning for obstructive sleep apnea (OSA). I had a long chat with the patient and his wife about my findings and the diagnosis of OSA, its prognosis and treatment options. We talked about medical treatments, surgical interventions and non-pharmacological approaches. I explained in particular the risks and ramifications of untreated moderate to severe OSA, especially with respect to developing cardiovascular disease down the Road, including congestive heart failure, difficult to treat hypertension, cardiac arrhythmias, or stroke. Even type 2 diabetes has, in part, been linked to untreated OSA. Symptoms of untreated OSA include daytime sleepiness, memory problems, mood irritability and mood disorder such as depression and anxiety, lack of energy, as well as recurrent headaches, especially morning headaches. We talked about trying to maintain a healthy lifestyle in general, as well as the importance of weight control. I encouraged the patient to eat healthy, exercise daily and keep well hydrated, to keep a scheduled bedtime and wake time routine, to not skip any meals and eat healthy snacks in between meals. I advised the patient not to drive when feeling sleepy.  I recommended the following at this time: sleep study with potential positive airway pressure titration. (We will score hypopneas at 4%).  V time of this dictation I was able to review his overnight pulse oximetry test which he had on 05/10/2017. Duration of testing time was 7 hours and 48 minutes,  average oxygen saturation was 91.4%, nadir was 73% but could be an error. Time below or at 88% saturation was 8.5 minutes.  I explained the sleep test procedure to the patient and also outlined possible surgical and non-surgical treatment options of OSA, including the use of a custom-made dental device (which would require a referral to a specialist dentist or oral surgeon), upper airway surgical options, such as pillar implants, radiofrequency surgery, tongue base surgery, and UPPP (which would involve a referral to an ENT surgeon). Rarely, jaw surgery such as mandibular advancement may be considered.  I also explained the CPAP treatment option to the patient, who indicated that he would be willing to try CPAP if the need arises. I explained the importance of being compliant with PAP treatment, not only for insurance purposes but primarily to improve His symptoms, and for the patient's long term health benefit, including to reduce His cardiovascular risks. I answered all their questions today and the patient and his wife were in agreement. I would like to see him back after the sleep study is completed and encouraged him to call with any interim questions, concerns, problems or updates.   Thank you very much for allowing me to participate in the care of this nice patient. If I can be of any further assistance to you please do not hesitate to call me at [phone].  Sincerely,   Star Age, MD, PhD

## 2017-07-04 DIAGNOSIS — D751 Secondary polycythemia: Secondary | ICD-10-CM | POA: Diagnosis not present

## 2017-07-05 ENCOUNTER — Telehealth: Payer: Self-pay | Admitting: Hematology and Oncology

## 2017-07-05 ENCOUNTER — Telehealth: Payer: Self-pay | Admitting: Neurology

## 2017-07-05 DIAGNOSIS — G4734 Idiopathic sleep related nonobstructive alveolar hypoventilation: Secondary | ICD-10-CM

## 2017-07-05 NOTE — Telephone Encounter (Signed)
Patient called to cancel °

## 2017-07-05 NOTE — Telephone Encounter (Signed)
Ins denied in lab study. Please place an order for a hst.

## 2017-07-09 ENCOUNTER — Telehealth: Payer: Self-pay | Admitting: Hematology and Oncology

## 2017-07-09 NOTE — Telephone Encounter (Signed)
Per 4/15 sch msg No need for PHLEB at this time

## 2017-07-16 NOTE — Telephone Encounter (Signed)
HST order placed. 

## 2017-07-16 NOTE — Addendum Note (Signed)
Addended by: Lester [place] A on: 07/16/2017 10:10 AM   Modules accepted: Orders

## 2017-07-19 ENCOUNTER — Other Ambulatory Visit: Payer: PPO

## 2017-07-19 ENCOUNTER — Ambulatory Visit: Payer: PPO | Admitting: Hematology and Oncology

## 2017-07-21 DIAGNOSIS — I2510 Atherosclerotic heart disease of native coronary artery without angina pectoris: Secondary | ICD-10-CM | POA: Diagnosis not present

## 2017-07-25 ENCOUNTER — Ambulatory Visit (INDEPENDENT_AMBULATORY_CARE_PROVIDER_SITE_OTHER): Payer: PPO | Admitting: Neurology

## 2017-07-25 DIAGNOSIS — G4733 Obstructive sleep apnea (adult) (pediatric): Secondary | ICD-10-CM

## 2017-07-25 DIAGNOSIS — G4734 Idiopathic sleep related nonobstructive alveolar hypoventilation: Secondary | ICD-10-CM

## 2017-08-01 ENCOUNTER — Telehealth: Payer: Self-pay

## 2017-08-01 ENCOUNTER — Encounter: Payer: Self-pay | Admitting: Neurology

## 2017-08-01 NOTE — Telephone Encounter (Signed)
I called pt. I advised pt that Dr. Rexene Alberts reviewed their sleep study results and found that pt has mild osa. Dr. Rexene Alberts recommends that pt start an auto pap at home to see if he feels better after treatment. I advised pt that he may still have an underlying lung disease, for which he should speak with his PCP regarding a possible referral to pulmonology. An underlying cause for his abnormal ONO and lower average O2 sats cannot be excluded from the HST. I reviewed PAP compliance expectations with the pt. Pt is agreeable to starting an auto-PAP. I advised pt that an order will be sent to a DME, [facility], and [facility] will call the pt within about one week after they file with the pt's insurance. [facility] will show the pt how to use the machine, fit for masks, and troubleshoot the auto-PAP if needed. A follow up appt was made for insurance purposes with Dr. Rexene Alberts on 11/01/17 at 9:30am. Pt verbalized understanding to arrive 15 minutes early and bring their auto-PAP. A letter with all of this information in it will be sent to the pt's mychart as a reminder. I verified with the pt that the address we have on file is correct. Pt verbalized understanding of results. Pt had no questions at this time but was encouraged to call back if questions arise.

## 2017-08-01 NOTE — Telephone Encounter (Signed)
-----   Message from Star Age, MD sent at 08/01/2017  7:42 AM EDT ----- Patient referred by Dr. Brigitte Pulse, seen by me on 06/20/17, HST on 07/25/17.    Please call and notify the patient that the recent home sleep test showed obstructive sleep apnea. OSA is overall mild, but worth treating to see if he feels better after treatment and his O2 sats improve. However, he may still have underlying lung d/s. Please note that an underlying pulmonary cause for his abnormal prior pulse oximetry test and his lower average oxygen saturations, cannot be excluded based on the home sleep test. Evaluation with a pulmonologist may be beneficial. I would encourage him to speak with PCP about seeing a lung specialist. From my end, I recommend treatment for his OSA in the form of autoPAP, which means, that we don't have to bring him in for a sleep study with CPAP, but will let him try an autoPAP machine at home, through a DME company (of his choice, or as per insurance requirement, he likely has a DME that provided the O2 at home). The DME representative will educate him on how to use the machine, how to put the mask on, etc. I have placed an order in the chart. Please send referral, talk to patient, send report to referring MD. We will need a FU in sleep clinic for 10 weeks post-PAP set up, please arrange that with me or one of our NPs. Thanks,   Star Age, MD, PhD [facility])

## 2017-08-01 NOTE — Addendum Note (Signed)
Addended by: Star Age on: 08/01/2017 07:42 AM   Modules accepted: Orders

## 2017-08-01 NOTE — Procedures (Signed)
[facility] @[facility] [address] [place], [place] [address] NAME:  Nicholas Hernandez                                                  DOB: [birth year] MEDICAL RECORD NUMBER [id number]                                           DOS: 07/25/17 REFERRING PHYSICIAN: Marton Redwood, MD STUDY PERFORMED: Home Sleep Test  HISTORY: 72 year old man with a history of erythrocytosis, prior smoking, allergic rhinitis, coronary artery disease with status post stent placement, chronic low back pain, hypertension, reflux disease, esophageal stricture status post dilatation, hyperlipidemia, and overweight state, who had an abnormal overnight pulse oximetry test. Epworth is 1/24, BMI: 26.7  STUDY RESULTS:  Total Recording Time:  8 hours, 14 minutes (total valid test time: 3 h, 53 min) Total Apnea/Hypopnea Index (AHI): 10.3/h, RDI:  13.5/h Average Oxygen Saturation: 90%,   Lowest Oxygen Desaturation: 75%  Total Time Oxygen Saturation Below or at 88 %:  40.43minutes (15%)  Average Heart Rate:  70 bpm  IMPRESSION: OSA RECOMMENDATION: This home sleep test demonstrates overall mild obstructive sleep apnea with a total AHI of 10.3/hour and O2 nadir of 75%. Given the patient's medical history and sleep related complaints, treatment with positive airway pressure is recommended. This can be achieved in the form of autoPAP trial/titration at home. A full night CPAP titration study will help with proper treatment settings and mask fitting, if needed in the future. Please note that an underlying pulmonary cause for his abnormal prior pulse oximetry test and his lower average oxygen saturations, cannot be excluded based on this test. Evaluation with a pulmonologist may be beneficial. Untreated obstructive sleep apnea may carry additional perioperative morbidity. Patients with significant obstructive sleep apnea should receive perioperative PAP therapy and the surgeons and particularly the anesthesiologist should  be informed of the diagnosis and the severity of the sleep disordered breathing. The patient should be cautioned not to drive, work at heights, or operate dangerous or heavy equipment when tired or sleepy. Review and reiteration of good sleep hygiene measures should be pursued with any patient. Other causes of the patient's symptoms, including circadian rhythm disturbances, an underlying mood disorder, medication effect and/or an underlying medical problem cannot be ruled out based on this test. Clinical correlation is recommended. The patient and his referring provider will be notified of the test results. The patient will be seen in follow up in sleep clinic at [facility]. I certify that I have reviewed the raw data recording prior to the issuance of this report in accordance with the standards of the American Academy of Sleep Medicine (AASM).  Star Age, MD, PhD [facility]) Diplomat, ABPN (Neurology and Sleep)

## 2017-08-01 NOTE — Progress Notes (Signed)
Patient referred by Dr. Brigitte Pulse, seen by me on 06/20/17, HST on 07/25/17.    Please call and notify the patient that the recent home sleep test showed obstructive sleep apnea. OSA is overall mild, but worth treating to see if he feels better after treatment and his O2 sats improve. However, he may still have underlying lung d/s. Please note that an underlying pulmonary cause for his abnormal prior pulse oximetry test and his lower average oxygen saturations, cannot be excluded based on the home sleep test. Evaluation with a pulmonologist may be beneficial. I would encourage him to speak with PCP about seeing a lung specialist. From my end, I recommend treatment for his OSA in the form of autoPAP, which means, that we don't have to bring him in for a sleep study with CPAP, but will let him try an autoPAP machine at home, through a DME company (of his choice, or as per insurance requirement, he likely has a DME that provided the O2 at home). The DME representative will educate him on how to use the machine, how to put the mask on, etc. I have placed an order in the chart. Please send referral, talk to patient, send report to referring MD. We will need a FU in sleep clinic for 10 weeks post-PAP set up, please arrange that with me or one of our NPs. Thanks,   Star Age, MD, PhD [facility])

## 2017-08-06 DIAGNOSIS — M545 Low back pain: Secondary | ICD-10-CM | POA: Diagnosis not present

## 2017-08-13 DIAGNOSIS — G4733 Obstructive sleep apnea (adult) (pediatric): Secondary | ICD-10-CM | POA: Diagnosis not present

## 2017-09-13 DIAGNOSIS — G4733 Obstructive sleep apnea (adult) (pediatric): Secondary | ICD-10-CM | POA: Diagnosis not present

## 2017-10-11 DIAGNOSIS — D751 Secondary polycythemia: Secondary | ICD-10-CM | POA: Diagnosis not present

## 2017-10-13 DIAGNOSIS — G4733 Obstructive sleep apnea (adult) (pediatric): Secondary | ICD-10-CM | POA: Diagnosis not present

## 2017-10-25 DIAGNOSIS — C44319 Basal cell carcinoma of skin of other parts of face: Secondary | ICD-10-CM | POA: Diagnosis not present

## 2017-10-25 DIAGNOSIS — L570 Actinic keratosis: Secondary | ICD-10-CM | POA: Diagnosis not present

## 2017-10-25 DIAGNOSIS — Z85828 Personal history of other malignant neoplasm of skin: Secondary | ICD-10-CM | POA: Diagnosis not present

## 2017-10-25 DIAGNOSIS — L219 Seborrheic dermatitis, unspecified: Secondary | ICD-10-CM | POA: Diagnosis not present

## 2017-10-30 ENCOUNTER — Encounter: Payer: Self-pay | Admitting: Neurology

## 2017-11-01 ENCOUNTER — Ambulatory Visit (INDEPENDENT_AMBULATORY_CARE_PROVIDER_SITE_OTHER): Payer: PPO | Admitting: Neurology

## 2017-11-01 ENCOUNTER — Encounter: Payer: Self-pay | Admitting: Neurology

## 2017-11-01 VITALS — BP 132/80 | HR 80 | Ht 71.0 in | Wt 192.5 lb

## 2017-11-01 DIAGNOSIS — G4733 Obstructive sleep apnea (adult) (pediatric): Secondary | ICD-10-CM

## 2017-11-01 DIAGNOSIS — D751 Secondary polycythemia: Secondary | ICD-10-CM

## 2017-11-01 DIAGNOSIS — Z9989 Dependence on other enabling machines and devices: Secondary | ICD-10-CM

## 2017-11-01 NOTE — Progress Notes (Signed)
Subjective:    Patient ID: Nicholas Hernandez is a 72 y.o. male.  HPI     Interim history:   [name] is a 72 year old right-handed gentleman with an underlying medical history of erythrocytosis, prior smoking, allergic rhinitis, coronary artery disease with status post stent placement, chronic low back pain, hypertension, reflux disease, esophageal stricture status post dilatation, hyperlipidemia, impaired fasting glucose, history of shingles and overweight state, who presents for follow-up consultation of his obstructive sleep apnea, after home sleep testing and starting AutoPap therapy. The patient is unaccompanied today. I first met him on 06/20/2017 at the request of his primary care physician, at which time he reported a recent abnormal ONO. He was advised to proceed with sleep study testing. His insurance denied a lab attended sleep study. He had a home sleep test on 07/25/2017 which indicated overall mild sleep apnea with an AHI of 10.3 per hour, average oxygen saturation of only at 90%, nadir of 75% with significant time below or at 88% saturation of 40.1 minutes. He was advised to proceed with AutoPap therapy at home.  Today, 11/01/2017: I reviewed his AutoPap compliance data from 10/01/2017 through 10/30/2017 which is a total of 30 days, during which time he used his AutoPap every night with percent used days greater than 4 hours at 97%, indicating excellent compliance with an average usage of 7 hours and 20 minutes, residual AHI at goal at 1.7 per hour, 95th percentile pressure at 7.9 cm with a range of 5 cm to 13 cm with EPR. Leak acceptable with the 95th percentile at 12.4 L/m. He reports doing well with AutoPap. He has adapted well to treatment fairly quickly. In fact, he noticed an improvement in his sleep consolidation, sleep quality, nocturia and daytime tiredness. In addition, he reports that his hemoglobin has come down. Before he was placed on oxygen at night by his PCP, his hemoglobin  was 18.5. In April when he was on oxygen at night his hemoglobin was 17.9. After the sleep test and starting AutoPap therapy, he was able to discontinue oxygen at home and his most recent hemoglobin in July 2019 was 17.1. He is quite pleased with the outcome thus far. He is motivated to continue with treatment.  The patient's allergies, current medications, family history, past medical history, past social history, past surgical history and problem list were reviewed and updated as appropriate.   Previously:   06/20/2017: (He) reports a recent overnight pulse oximetry test showed oxygen desaturations. We will request those test results from your office. He has no loud snoring, per wife, but reports sleep disruption, nonrestorative sleep, no telltale significant sleepiness during the day but certainly does not wake up rested typically. He has been on supplemental oxygen for the past 3 weeks or so since he had a abnormal finding on the pulse oximetry test. He feels that perhaps the oxygen has helped, at 2 L/m via oxygen concentrator. He has noticed less interruption of his sleep and less nocturia. He currently has nocturia once per average night but used to get up more than that. He takes Ambien very cautiously, usually 5 mg as needed, maybe once a month or so. He is not aware of any family history of OSA. He had tonsillectomy at age 10. He has had some morning headaches which are dull and achy not severe. I reviewed your office note from 04/25/2017, which you kindly included. His Epworth sleepiness score is 1 out of 24, fatigue score is 12 out of  15. He is married and lives with his wife. They have 2 children. He stopped smoking in 1989. He drinks alcohol the form of beer, typically 2 per day and drinks caffeine in the form of coffee, 4 cups per day on average.   His Past Medical History Is Significant For: Past Medical History:  Diagnosis Date  . Allergic rhinitis   . CAD (coronary artery disease)     post 3.5 x 37m xience durg-eluting stent to distal right coronary artery  . Chronic LBP   . Esophageal stricture    status post dilatation 10-08-08  . Essential hypertension, benign   . External hemorrhoids   . GERD (gastroesophageal reflux disease)   . History of esophageal stricture   . HLD (hyperlipidemia)   . Impaired fasting glucose   . Shingles     His Past Surgical History Is Significant For: Past Surgical History:  Procedure Laterality Date  . APPENDECTOMY    . right inguinal herniorrhaphy    . TONSILLECTOMY      His Family History Is Significant For: Family History  Problem Relation Age of Onset  . Heart attack Father        MI at [phone] . Hypertension Father   . Diabetes Mellitus II Father   . Heart attack Paternal Grandfather   . Heart attack Maternal Grandfather   . Healthy Sister   . Healthy Daughter   . Healthy Daughter     His Social History Is Significant For: Social History   Socioeconomic History  . Marital status: Married    Spouse name: Not on file  . Number of children: Not on file  . Years of education: Not on file  . Highest education level: Not on file  Occupational History  . Not on file  Social Needs  . Financial resource strain: Not on file  . Food insecurity:    Worry: Not on file    Inability: Not on file  . Transportation needs:    Medical: Not on file    Non-medical: Not on file  Tobacco Use  . Smoking status: Former SResearch scientist (life sciences) . Smokeless tobacco: Never Used  Substance and Sexual Activity  . Alcohol use: Yes    Alcohol/week: 0.0 standard drinks    Comment: occas  . Drug use: No  . Sexual activity: Not on file  Lifestyle  . Physical activity:    Days per week: Not on file    Minutes per session: Not on file  . Stress: Not on file  Relationships  . Social connections:    Talks on phone: Not on file    Gets together: Not on file    Attends religious service: Not on file    Active member of club or organization: Not on  file    Attends meetings of clubs or organizations: Not on file    Relationship status: Not on file  Other Topics Concern  . Not on file  Social History Narrative  . Not on file    His Allergies Are:  Allergies  Allergen Reactions  . Bee Venom Swelling  . Codeine Nausea And Vomiting  :   His Current Medications Are:  Outpatient Encounter Medications as of 11/01/2017  Medication Sig  . amLODipine (NORVASC) 5 MG tablet Take 5 mg by mouth daily.  .Marland Kitchenaspirin 81 MG tablet Take 81 mg by mouth daily.    .Marland Kitchenezetimibe (ZETIA) 10 MG tablet Take 10 mg by mouth daily.  .Marland Kitchen  fluticasone (FLONASE) 50 MCG/ACT nasal spray HOLD RIGHT NOW  . Ginkgo Biloba (GINKOBA) 40 MG TABS Take 40 mg by mouth daily.   Marland Kitchen levocetirizine (XYZAL) 5 MG tablet Take 5 mg by mouth every evening.  Marland Kitchen LIPITOR 40 MG tablet TAKE ONE TABLET BY MOUTH DAILY.  . nitroGLYCERIN (NITROSTAT) 0.4 MG SL tablet Place 0.4 mg under the tongue every 5 (five) minutes as needed for chest pain (chest pain).  Marland Kitchen OVER THE COUNTER MEDICATION Take 4 oz by mouth daily. Tart Cherry Juice  . OVER THE COUNTER MEDICATION Tart Cherry Juice  . pantoprazole (PROTONIX) 40 MG tablet TAKE 1 TABLET BY MOUTH ONCE A DAY  . Psyllium (METAMUCIL PO) Take 1 Dose by mouth 2 (two) times daily.   . Turmeric 500 MG CAPS Take 1,000 mg by mouth daily.  Marland Kitchen ZINC SULFATE PO Take 100 mg by mouth daily.   Marland Kitchen zolpidem (AMBIEN) 10 MG tablet Take 10 mg by mouth as needed (sleep).    No facility-administered encounter medications on file as of 11/01/2017.   :  Review of Systems:  Out of a complete 14 point review of systems, all are reviewed and negative with the exception of these symptoms as listed below:  Review of Systems  Neurological:       Patient reports that he has been doing well.     Objective:  Neurological Exam  Physical Exam Physical Examination:   Vitals:   11/01/17 0925  BP: 132/80  Pulse: 80    General Examination: The patient is a very pleasant 72  y.o. male in no acute distress. He appears well-developed and well-nourished and well groomed.   HEENT: Normocephalic, atraumatic, pupils are equal, round and reactive to light and accommodation. Corrective eyeglasses in place. Extraocular tracking is good without limitation to gaze excursion or nystagmus noted. Normal smooth pursuit is noted. Hearing is grossly intact. Face is symmetric with normal facial animation and normal facial sensation. Speech is clear with no dysarthria noted. There is no hypophonia. There is no lip, neck/head, jaw or voice tremor. Neck is supple with full range of passive and active motion. There are no carotid bruits on auscultation. Oropharynx exam reveals: mild mouth dryness, adequate dental hygiene and mild airway crowding. Tongue protrudes centrally and palate elevates symmetrically.   Chest: Clear to auscultation without wheezing, rhonchi or crackles noted.  Heart: S1+S2+0, regular and normal without murmurs, rubs or gallops noted.   Abdomen: Soft, non-tender and non-distended with normal bowel sounds appreciated on auscultation.  Extremities: There is no pitting edema in the distal lower extremities bilaterally.   Skin: Warm and dry without trophic changes noted.  Musculoskeletal: exam reveals no obvious joint deformities, tenderness or joint swelling or erythema.   Neurologically:  Mental status: The patient is awake, alert and oriented in all 4 spheres. His immediate and remote memory, attention, language skills and fund of knowledge are appropriate. There is no evidence of aphasia, agnosia, apraxia or anomia. Speech is clear with normal prosody and enunciation. Thought process is linear. Mood is normal and affect is normal.  Cranial nerves II - XII are as described above under HEENT exam. In addition: shoulder shrug is normal with equal shoulder height noted. Motor exam: Normal bulk, strength and tone is noted. There is no tremor. Fine motor skills and  coordination: grossly intact.  Cerebellar testing: No dysmetria or intention tremor. There is no truncal or gait ataxia.  Sensory exam: intact to light touch in the upper and lower  extremities.  Gait, station and balance: He stands easily. No veering to one side is noted. No leaning to one side is noted. Posture is age-appropriate and stance is narrow based. Gait shows normal stride length and normal pace. No problems turning are noted.   Assessment and Plan:   In summary, BOWIE DELIA is a very pleasant 72 year old male with an underlying medical history of erythrocytosis, prior smoking, allergic rhinitis, coronary artery disease with status post stent placement, chronic low back pain, hypertension, reflux disease, esophageal stricture status post dilatation, hyperlipidemia, impaired fasting glucose, history of shingles and overweight state, who presents for follow-up consultation of his obstructive sleep apnea which was determined to be in the mild range overall by home sleep testing on 07/25/2017. Prior to that he had an abnormal overnight pulse oximetry test through his primary care physician's office. He has established treatment with AutoPap therapy at home. He has indicated good results from the symptomatic standpoint including better sleep consolidation, sleep quality, less nocturia, less daytime tiredness. In addition, there is evidence that his hemoglobin has improved and come down a little bit over the past 6 months. He is compliant with treatment and highly commended for this. He is motivated to continue with treatment. He is advised to continue his AutoPap at the current settings, he is using nasal pillows successfully. I suggested a routine follow-up in 6 months, he reports that he received a letter from his insurance that his CPAP is approved temporarily. However, given the improvements in his sleep-related symptoms, and his hemoglobin and in light of his medical history including history of  coronary artery disease, ongoing treatment with CPAP is justified and recommended by me. I answered all his questions today and he was in agreement with the plan. Hopefully, if he continues to do well, he can be seen on a yearly basis.  I spent 25 minutes in total face-to-face time with the patient, more than 50% of which was spent in counseling and coordination of care, reviewing test results, reviewing medication and discussing or reviewing the diagnosis of OSA, its prognosis and treatment options. Pertinent laboratory and imaging test results that were available during this visit with the patient were reviewed by me and considered in my medical decision making (see chart for details).

## 2017-11-01 NOTE — Patient Instructions (Signed)

## 2017-11-13 DIAGNOSIS — G4733 Obstructive sleep apnea (adult) (pediatric): Secondary | ICD-10-CM | POA: Diagnosis not present

## 2017-12-14 DIAGNOSIS — G4733 Obstructive sleep apnea (adult) (pediatric): Secondary | ICD-10-CM | POA: Diagnosis not present

## 2017-12-18 DIAGNOSIS — R82998 Other abnormal findings in urine: Secondary | ICD-10-CM | POA: Diagnosis not present

## 2017-12-18 DIAGNOSIS — Z125 Encounter for screening for malignant neoplasm of prostate: Secondary | ICD-10-CM | POA: Diagnosis not present

## 2017-12-18 DIAGNOSIS — E7849 Other hyperlipidemia: Secondary | ICD-10-CM | POA: Diagnosis not present

## 2017-12-18 DIAGNOSIS — R7301 Impaired fasting glucose: Secondary | ICD-10-CM | POA: Diagnosis not present

## 2017-12-18 DIAGNOSIS — I10 Essential (primary) hypertension: Secondary | ICD-10-CM | POA: Diagnosis not present

## 2017-12-27 DIAGNOSIS — I10 Essential (primary) hypertension: Secondary | ICD-10-CM | POA: Diagnosis not present

## 2017-12-27 DIAGNOSIS — E7849 Other hyperlipidemia: Secondary | ICD-10-CM | POA: Diagnosis not present

## 2017-12-27 DIAGNOSIS — D751 Secondary polycythemia: Secondary | ICD-10-CM | POA: Diagnosis not present

## 2017-12-27 DIAGNOSIS — R808 Other proteinuria: Secondary | ICD-10-CM | POA: Diagnosis not present

## 2017-12-27 DIAGNOSIS — Z1389 Encounter for screening for other disorder: Secondary | ICD-10-CM | POA: Diagnosis not present

## 2017-12-27 DIAGNOSIS — Z9861 Coronary angioplasty status: Secondary | ICD-10-CM | POA: Diagnosis not present

## 2017-12-27 DIAGNOSIS — R7301 Impaired fasting glucose: Secondary | ICD-10-CM | POA: Diagnosis not present

## 2017-12-27 DIAGNOSIS — G4734 Idiopathic sleep related nonobstructive alveolar hypoventilation: Secondary | ICD-10-CM | POA: Diagnosis not present

## 2017-12-27 DIAGNOSIS — Z6826 Body mass index (BMI) 26.0-26.9, adult: Secondary | ICD-10-CM | POA: Diagnosis not present

## 2017-12-27 DIAGNOSIS — Z23 Encounter for immunization: Secondary | ICD-10-CM | POA: Diagnosis not present

## 2017-12-27 DIAGNOSIS — Z0000 Encounter for general adult medical examination without abnormal findings: Secondary | ICD-10-CM | POA: Diagnosis not present

## 2017-12-27 DIAGNOSIS — G4733 Obstructive sleep apnea (adult) (pediatric): Secondary | ICD-10-CM | POA: Diagnosis not present

## 2017-12-27 DIAGNOSIS — R972 Elevated prostate specific antigen [PSA]: Secondary | ICD-10-CM | POA: Diagnosis not present

## 2018-01-04 DIAGNOSIS — Z1212 Encounter for screening for malignant neoplasm of rectum: Secondary | ICD-10-CM | POA: Diagnosis not present

## 2018-01-10 DIAGNOSIS — C44319 Basal cell carcinoma of skin of other parts of face: Secondary | ICD-10-CM | POA: Diagnosis not present

## 2018-01-13 DIAGNOSIS — G4733 Obstructive sleep apnea (adult) (pediatric): Secondary | ICD-10-CM | POA: Diagnosis not present

## 2018-02-01 ENCOUNTER — Encounter: Payer: Self-pay | Admitting: Neurology

## 2018-02-01 DIAGNOSIS — J449 Chronic obstructive pulmonary disease, unspecified: Secondary | ICD-10-CM | POA: Diagnosis not present

## 2018-02-01 DIAGNOSIS — R0902 Hypoxemia: Secondary | ICD-10-CM | POA: Diagnosis not present

## 2018-02-04 DIAGNOSIS — H9313 Tinnitus, bilateral: Secondary | ICD-10-CM | POA: Diagnosis not present

## 2018-02-04 DIAGNOSIS — H903 Sensorineural hearing loss, bilateral: Secondary | ICD-10-CM | POA: Diagnosis not present

## 2018-02-04 DIAGNOSIS — Z570 Occupational exposure to noise: Secondary | ICD-10-CM | POA: Diagnosis not present

## 2018-02-05 ENCOUNTER — Telehealth: Payer: Self-pay

## 2018-02-05 NOTE — Telephone Encounter (Signed)
Pt returned my call. He reports that he was using his auto pap the night of his ONO. The probe slipped off of his finger during the night which explains why he only has 2hrs and 34 mins of data. Pt is agreeable to repeating the ONO if needed.

## 2018-02-05 NOTE — Telephone Encounter (Signed)
I called pt to discuss. No answer, left a message asking him to call me back. 

## 2018-02-05 NOTE — Telephone Encounter (Signed)
Pt returned my call. He is agreeable to repeating the ONO and understands that [facility] will be in touch with him regarding this.  I have informed [facility] of the need to repeat pt's ONO.

## 2018-02-05 NOTE — Telephone Encounter (Signed)
Received ONO results. I called pt to confirm that he was using oximeter with the auto pap.  No answer,left a message asking him to call me back.

## 2018-02-05 NOTE — Telephone Encounter (Signed)
I would recommend repeating ONO. Not enough data, may be missing REM sleep data.

## 2018-02-13 DIAGNOSIS — G4733 Obstructive sleep apnea (adult) (pediatric): Secondary | ICD-10-CM | POA: Diagnosis not present

## 2018-02-18 DIAGNOSIS — G4733 Obstructive sleep apnea (adult) (pediatric): Secondary | ICD-10-CM | POA: Diagnosis not present

## 2018-03-04 DIAGNOSIS — H903 Sensorineural hearing loss, bilateral: Secondary | ICD-10-CM | POA: Diagnosis not present

## 2018-03-08 DIAGNOSIS — R972 Elevated prostate specific antigen [PSA]: Secondary | ICD-10-CM | POA: Diagnosis not present

## 2018-03-15 ENCOUNTER — Encounter: Payer: Self-pay | Admitting: Neurology

## 2018-03-15 DIAGNOSIS — G4733 Obstructive sleep apnea (adult) (pediatric): Secondary | ICD-10-CM | POA: Diagnosis not present

## 2018-03-29 ENCOUNTER — Encounter: Payer: Self-pay | Admitting: Neurology

## 2018-04-01 DIAGNOSIS — R0902 Hypoxemia: Secondary | ICD-10-CM | POA: Diagnosis not present

## 2018-04-01 DIAGNOSIS — J449 Chronic obstructive pulmonary disease, unspecified: Secondary | ICD-10-CM | POA: Diagnosis not present

## 2018-04-02 ENCOUNTER — Encounter: Payer: Self-pay | Admitting: Neurology

## 2018-04-03 DIAGNOSIS — R972 Elevated prostate specific antigen [PSA]: Secondary | ICD-10-CM | POA: Diagnosis not present

## 2018-04-09 NOTE — Telephone Encounter (Signed)
Received repeat ONO on auto pap results.

## 2018-04-09 NOTE — Telephone Encounter (Signed)
I reviewed patient's pulse oximetry test results, but date is incorrect. Please contact DME company to correct the date and also to document that he was on AutoPap at the time.

## 2018-04-09 NOTE — Telephone Encounter (Signed)
I have contacted [facility] for these corrections.

## 2018-04-10 NOTE — Telephone Encounter (Signed)
Received corrected report. In your office for review.

## 2018-04-10 NOTE — Telephone Encounter (Signed)
I reviewed patient's pulse oximetry test results from 04/01/2018, total duration of testing time was 7 hours and 27 minutes. He was on room air, on PAP. Average oxygen saturation was 93%, lowest was 88%, the lowest reported on the report summary was 60% but that seems to be an error. Time below or at 88% saturation was 3 minutes and 36 seconds for the night. As it stands, his sleep apnea is well treated with AutoPap without supplemental oxygen needed. Please call patient back and advise him to continue with his AutoPap and no additional oxygen is needed. Results are reassuring. He can keep his follow-up appointment as previously scheduled for next month.

## 2018-04-10 NOTE — Telephone Encounter (Signed)
I called pt and advised him of his ONO results. I reminded pt of his appt next month. Pt verbalized understanding of results. Pt had no questions at this time but was encouraged to call back if questions arise.

## 2018-04-15 DIAGNOSIS — G4733 Obstructive sleep apnea (adult) (pediatric): Secondary | ICD-10-CM | POA: Diagnosis not present

## 2018-04-23 ENCOUNTER — Encounter: Payer: Self-pay | Admitting: Neurology

## 2018-05-06 ENCOUNTER — Ambulatory Visit: Payer: PPO | Admitting: Neurology

## 2018-05-06 ENCOUNTER — Encounter: Payer: Self-pay | Admitting: Neurology

## 2018-05-06 VITALS — BP 130/88 | HR 83 | Ht 71.0 in | Wt 192.0 lb

## 2018-05-06 DIAGNOSIS — G4733 Obstructive sleep apnea (adult) (pediatric): Secondary | ICD-10-CM

## 2018-05-06 DIAGNOSIS — Z9989 Dependence on other enabling machines and devices: Secondary | ICD-10-CM | POA: Diagnosis not present

## 2018-05-06 NOTE — Progress Notes (Signed)
Subjective:    Patient ID: Nicholas Hernandez is a 73 y.o. male.  HPI     Interim history:   Nicholas Hernandez is a 73 year old right-handed gentleman with an underlying medical history of erythrocytosis, prior smoking, allergic rhinitis, coronary artery disease with status post stent placement, chronic low back pain, hypertension, reflux disease, esophageal stricture (s/p dilatation), hyperlipidemia, impaired fasting glucose, history of shingles and overweight state, who presents for follow-up consultation of his obstructive sleep apnea, on AutoPap therapy. The patient is unaccompanied today. I last saw him on 11/01/2017, at which time he was compliant on AutoPap. He had notice an improvement in his sleep quality, nocturia and daytime tiredness. His hemoglobin had improved as well. I suggested we proceed with a overnight pulse oximetry test while he is on AutoPap therapy.   He had an interim pulse oximetry test while on AutoPap therapy on 04/01/2018 which showed adequate oxygen saturations while on AutoPap therapy and no significant desaturations, no need for supplemental oxygen. He was advised via phone call of the test results.   Today, 05/06/2018: I reviewed his AutoPap compliance data from 04/02/2018 through 05/01/2018 which is a total of 30 days, during which time he used his AutoPap every night with percent used days greater than 4 hours at 97%, indicating excellent compliance with an average usage of 7 hours and 18 minutes, residual AHI at goal at 1.4 per hour, 95th percentile of pressure at 9.8 cm, leak acceptable with the 95th percentile at 11.8 L/m on a pressure range of 5 cm to 13 cm with EPR of 3. He reports doing well, sleep is good on AutoPap therapy, he is compliant with treatment and indicates ongoing good results. He is typically up-to-date with supplies, has been using medium nasal pillows, his mindful about changing his filter on a monthly basis.    The patient's allergies, current  medications, family history, past medical history, past social history, past surgical history and problem list were reviewed and updated as appropriate.    Previously:    After home sleep testing and starting AutoPap therapy. The patient is unaccompanied today. I first met him on 06/20/2017 at the request of his primary care physician, at which time he reported a recent abnormal ONO. He was advised to proceed with sleep study testing. His insurance denied a lab attended sleep study. He had a home sleep test on 07/25/2017 which indicated overall mild sleep apnea with an AHI of 10.3 per hour, average oxygen saturation of only at 90%, nadir of 75% with significant time below or at 88% saturation of 40.1 minutes. He was advised to proceed with AutoPap therapy at home.   I reviewed his AutoPap compliance data from 10/01/2017 through 10/30/2017 which is a total of 30 days, during which time he used his AutoPap every night with percent used days greater than 4 hours at 97%, indicating excellent compliance with an average usage of 7 hours and 20 minutes, residual AHI at goal at 1.7 per hour, 95th percentile pressure at 7.9 cm with a range of 5 cm to 13 cm with EPR. Leak acceptable with the 95th percentile at 12.4 L/m.    06/20/2017: (He) reports a recent overnight pulse oximetry test showed oxygen desaturations. We will request those test results from your office. He has no loud snoring, per wife, but reports sleep disruption, nonrestorative sleep, no telltale significant sleepiness during the day but certainly does not wake up rested typically. He has been on supplemental oxygen for the  past 3 weeks or so since he had a abnormal finding on the pulse oximetry test. He feels that perhaps the oxygen has helped, at 2 L/m via oxygen concentrator. He has noticed less interruption of his sleep and less nocturia. He currently has nocturia once per average night but used to get up more than that. He takes Ambien very  cautiously, usually 5 mg as needed, maybe once a month or so. He is not aware of any family history of OSA. He had tonsillectomy at age 14. He has had some morning headaches which are dull and achy not severe. I reviewed your office note from 04/25/2017, which you kindly included. His Epworth sleepiness score is 1 out of 24, fatigue score is 12 out of 63. He is married and lives with his wife. They have 2 children. He stopped smoking in 1989. He drinks alcohol the form of beer, typically 2 per day and drinks caffeine in the form of coffee, 4 cups per day on average.   His Past Medical History Is Significant For: Past Medical History:  Diagnosis Date  . Allergic rhinitis   . CAD (coronary artery disease)    post 3.5 x 61m xience durg-eluting stent to distal right coronary artery  . Chronic LBP   . Esophageal stricture    status post dilatation 10-08-08  . Essential hypertension, benign   . External hemorrhoids   . GERD (gastroesophageal reflux disease)   . History of esophageal stricture   . HLD (hyperlipidemia)   . Impaired fasting glucose   . Shingles     His Past Surgical History Is Significant For: Past Surgical History:  Procedure Laterality Date  . APPENDECTOMY    . right inguinal herniorrhaphy    . TONSILLECTOMY      His Family History Is Significant For: Family History  Problem Relation Age of Onset  . Heart attack Father        MI at [phone] . Hypertension Father   . Diabetes Mellitus II Father   . Heart attack Paternal Grandfather   . Heart attack Maternal Grandfather   . Healthy Sister   . Healthy Daughter   . Healthy Daughter     His Social History Is Significant For: Social History   Socioeconomic History  . Marital status: Married    Spouse name: Not on file  . Number of children: Not on file  . Years of education: Not on file  . Highest education level: Not on file  Occupational History  . Not on file  Social Needs  . Financial resource strain:  Not on file  . Food insecurity:    Worry: Not on file    Inability: Not on file  . Transportation needs:    Medical: Not on file    Non-medical: Not on file  Tobacco Use  . Smoking status: Former SResearch scientist (life sciences) . Smokeless tobacco: Never Used  Substance and Sexual Activity  . Alcohol use: Yes    Alcohol/week: 0.0 standard drinks    Comment: occas  . Drug use: No  . Sexual activity: Not on file  Lifestyle  . Physical activity:    Days per week: Not on file    Minutes per session: Not on file  . Stress: Not on file  Relationships  . Social connections:    Talks on phone: Not on file    Gets together: Not on file    Attends religious service: Not on file    Active  member of club or organization: Not on file    Attends meetings of clubs or organizations: Not on file    Relationship status: Not on file  Other Topics Concern  . Not on file  Social History Narrative  . Not on file    His Allergies Are:  Allergies  Allergen Reactions  . Bee Venom Swelling  . Codeine Nausea And Vomiting  :   His Current Medications Are:  Outpatient Encounter Medications as of 05/06/2018  Medication Sig  . amLODipine (NORVASC) 5 MG tablet Take 5 mg by mouth daily.  Marland Kitchen aspirin 81 MG tablet Take 81 mg by mouth daily.    . cetirizine (ZYRTEC) 10 MG tablet Take 10 mg by mouth daily.  Marland Kitchen ezetimibe (ZETIA) 10 MG tablet Take 10 mg by mouth daily.  . fluticasone (FLONASE) 50 MCG/ACT nasal spray HOLD RIGHT NOW  . Ginkgo Biloba (GINKOBA) 40 MG TABS Take 40 mg by mouth daily.   Marland Kitchen LIPITOR 40 MG tablet TAKE ONE TABLET BY MOUTH DAILY.  . nitroGLYCERIN (NITROSTAT) 0.4 MG SL tablet Place 0.4 mg under the tongue every 5 (five) minutes as needed for chest pain (chest pain).  Marland Kitchen OVER THE COUNTER MEDICATION Take 4 oz by mouth daily. Tart Cherry Juice  . OVER THE COUNTER MEDICATION Tart Cherry Juice  . pantoprazole (PROTONIX) 40 MG tablet TAKE 1 TABLET BY MOUTH ONCE A DAY  . Psyllium (METAMUCIL PO) Take 1 Dose by mouth  2 (two) times daily.   . Turmeric 500 MG CAPS Take 1,000 mg by mouth daily.  Marland Kitchen ZINC SULFATE PO Take 100 mg by mouth daily.   Marland Kitchen zolpidem (AMBIEN) 10 MG tablet Take 10 mg by mouth as needed (sleep).   . [DISCONTINUED] levocetirizine (XYZAL) 5 MG tablet Take 5 mg by mouth every evening.   No facility-administered encounter medications on file as of 05/06/2018.   :  Review of Systems:  Out of a complete 14 point review of systems, all are reviewed and negative with the exception of these symptoms as listed below:  Review of Systems  Neurological:       Pt presents today to discuss his cpap. Pt reports that it is going well. Pt wants a copy of his ONO.    Objective:  Neurological Exam  Physical Exam Physical Examination:   Vitals:   05/06/18 1130  BP: 130/88  Pulse: 83    General Examination: The patient is a very pleasant 73 y.o. male in no acute distress. He appears well-developed and well-nourished and well groomed.   HEENT:Normocephalic, atraumatic, pupils are equal, round and reactive to light and accommodation. Corrective eyeglasses in place. Extraocular tracking is good. Hearing is grossly intact. Face is symmetric with normal facial animation and normal facial sensation. Speech is clear with no dysarthria noted. There is no hypophonia. There is no lip, neck/head, jaw or voice tremor. Neck with FROM. No nasal sores. Oropharynx exam reveals: mildmouth dryness, adequatedental hygiene and mildairway crowding.Tongue protrudes centrally and palate elevates symmetrically.   Chest:Clear to auscultation without wheezing, rhonchi or crackles noted.  Heart:S1+S2+0, regular and normal without murmurs, rubs or gallops noted.   Abdomen:Soft, non-tender and non-distended.  Extremities:There isnopitting edema in the distal lower extremities bilaterally.   Skin: Warm and dry without trophic changes noted.  Musculoskeletal: exam reveals no obvious joint deformities,  tenderness or joint swelling or erythema.   Neurologically:  Mental status: The patient is awake, alert and oriented in all 4 spheres.Hisimmediate and remote memory,  attention, language skills and fund of knowledge are appropriate. There is no evidence of aphasia, agnosia, apraxia or anomia. Speech is clear with normal prosody and enunciation. Thought process is linear. Mood is normaland affect is normal.  Cranial nerves II - XII are as described above under HEENT exam.  Motor exam: Normal bulk, strength and tone is noted. There is no tremor. Fine motor skills and coordination:grosslyintact.  Cerebellar testing: No dysmetria or intention tremor. There is no truncal or gait ataxia.  Sensory exam: intact to light touch in the upper and lower extremities.  Gait, station and balance:Hestands easily. No veering to one side is noted. No leaning to one side is noted. Posture is age-appropriate and stance is narrow based. Gait showsnormalstride length and normalpace. No problems turning are noted.   Assessmentand Plan:   In summary,Skylar C Reidis a very pleasant 76 year oldmalewith an underlying medical history of erythrocytosis, prior smoking, allergic rhinitis, coronary artery disease with status post stent placement, chronic low back pain, hypertension, reflux disease, esophageal stricture status post dilatation, hyperlipidemia, impaired fasting glucose, history of shingles and overweight state, who presents for follow-up consultation of his obstructive sleep apnea, established on AutoPap therapy at home. He has ongoing good compliance and good results. He had a home sleep test on 07/25/2017 which showed overall mild obstructive sleep apnea and prior to that he had an abnormal overnight pulse oximetry test through his primary care physician's office. We did an interim ONO in January 2020 which showed adequate oxygen saturations while he is on AutoPap therapy. He is advised to continue with  treatment. He is commended for his treatment adherence and advised to follow-up in one year routinely, he can see one of our nurse practitioners at the time. I answered all his questions today and he was in agreement. I spent 20 minutes in total face-to-face time with the patient, more than 50% of which was spent in counseling and coordination of care, reviewing test results, reviewing medication and discussing or reviewing the diagnosis of OSA, its prognosis and treatment options. Pertinent laboratory and imaging test results that were available during this visit with the patient were reviewed by me and considered in my medical decision making (see chart for details).

## 2018-05-06 NOTE — Patient Instructions (Signed)
Please continue using your autoPAP regularly. While your insurance requires that you use PAP at least 4 hours each night on 70% of the nights, I recommend, that you not skip any nights and use it throughout the night if you can. Getting used to PAP and staying with the treatment long term does take time and patience and discipline. Untreated obstructive sleep apnea when it is moderate to severe can have an adverse impact on cardiovascular health and raise her risk for heart disease, arrhythmias, hypertension, congestive heart failure, stroke and diabetes. Untreated obstructive sleep apnea causes sleep disruption, nonrestorative sleep, and sleep deprivation. This can have an impact on your day to day functioning and cause daytime sleepiness and impairment of cognitive function, memory loss, mood disturbance, and problems focussing. Using PAP regularly can improve these symptoms.  You have done well with the autoPAP. Your oxygen test was okay too.   You are up to date with the supplies.   We can see you in 1 year, you can see one of our nurse practitioners as you are stable. I will see you after that.

## 2018-05-09 ENCOUNTER — Encounter: Payer: Self-pay | Admitting: Cardiovascular Disease

## 2018-05-16 DIAGNOSIS — G4733 Obstructive sleep apnea (adult) (pediatric): Secondary | ICD-10-CM | POA: Diagnosis not present

## 2018-05-21 DIAGNOSIS — G4733 Obstructive sleep apnea (adult) (pediatric): Secondary | ICD-10-CM | POA: Diagnosis not present

## 2018-05-27 ENCOUNTER — Encounter: Payer: Self-pay | Admitting: Cardiovascular Disease

## 2018-05-27 ENCOUNTER — Ambulatory Visit: Payer: PPO | Admitting: Cardiovascular Disease

## 2018-05-27 VITALS — BP 138/70 | HR 61 | Ht 71.0 in | Wt 192.4 lb

## 2018-05-27 DIAGNOSIS — E782 Mixed hyperlipidemia: Secondary | ICD-10-CM

## 2018-05-27 DIAGNOSIS — I2510 Atherosclerotic heart disease of native coronary artery without angina pectoris: Secondary | ICD-10-CM | POA: Diagnosis not present

## 2018-05-27 DIAGNOSIS — I10 Essential (primary) hypertension: Secondary | ICD-10-CM

## 2018-05-27 NOTE — Patient Instructions (Signed)
Medication Instructions:  Your provider recommends that you continue on your current medications as directed. Please refer to the Current Medication list given to you today.    Labwork: None  Testing/Procedures: None  Follow-Up: Your provider wants you to follow-up in: 2 year with Dr. Burt Knack. You will receive a reminder letter in the mail two months in advance. If you don't receive a letter, please call our office to schedule the follow-up appointment.    Any Other Special Instructions Will Be Listed Below (If Applicable).     If you need a refill on your cardiac medications before your next appointment, please call your pharmacy.

## 2018-05-27 NOTE — Progress Notes (Signed)
Cardiology Office Note:    Date:  05/27/2018   ID:  Nicholas, Hernandez 08-Jun-1945, MRN [id number]  PCP:  Marton Redwood, MD  Cardiologist:  No primary care provider on file.  Electrophysiologist:  None   Referring MD: Marton Redwood, MD   Chief Complaint  Patient presents with  . Coronary Artery Disease   History of Present Illness:    Nicholas Hernandez is a 73 y.o. male with a hx of coronary artery disease, presenting for follow-up evaluation.  The patient initially presented in 2010 with unstable angina and was treated with PCI of severe stenosis of the right coronary artery.  He has had no recurrent ischemic events since that time.  He was last seen in March 2018 at which time he was doing quite well with an active lifestyle and no exertional symptoms.  The patient is here alone today.  Since he was seen last, he has developed elevated hemoglobin and has undergone extensive hematologic evaluation.  He was found to have nocturnal hypoxemia and is now treated with CPAP.  His last hemoglobin was 17.9 mg/dL which is just at the upper limits of normal range.  He denies any cardiac-related symptoms and specifically denies chest pain, chest pressure, or shortness of breath.  He does have mild leg edema which is improved since reducing his amlodipine dose from 10 mg down to 5 mg.  He has no orthopnea, PND, or heart palpitations.  He is physically active and walks 18 holes of golf regularly without symptoms.  Past Medical History:  Diagnosis Date  . Allergic rhinitis   . CAD (coronary artery disease)    post 3.5 x 40mm xience durg-eluting stent to distal right coronary artery  . Chronic LBP   . Esophageal stricture    status post dilatation 10-08-08  . Essential hypertension, benign   . External hemorrhoids   . GERD (gastroesophageal reflux disease)   . History of esophageal stricture   . HLD (hyperlipidemia)   . Impaired fasting glucose   . Shingles     Past Surgical History:  Procedure  Laterality Date  . APPENDECTOMY    . right inguinal herniorrhaphy    . TONSILLECTOMY      Current Medications: Current Meds  Medication Sig  . amLODipine (NORVASC) 5 MG tablet Take 5 mg by mouth daily.  Marland Kitchen aspirin 81 MG tablet Take 81 mg by mouth daily.    . cetirizine (ZYRTEC) 10 MG tablet Take 10 mg by mouth daily.  Marland Kitchen ezetimibe (ZETIA) 10 MG tablet Take 10 mg by mouth daily.  . fluticasone (FLONASE) 50 MCG/ACT nasal spray HOLD RIGHT NOW  . Ginkgo Biloba (GINKOBA) 40 MG TABS Take 40 mg by mouth daily.   Marland Kitchen LIPITOR 40 MG tablet TAKE ONE TABLET BY MOUTH DAILY.  . nitroGLYCERIN (NITROSTAT) 0.4 MG SL tablet Place 0.4 mg under the tongue every 5 (five) minutes as needed for chest pain (chest pain).  Marland Kitchen OVER THE COUNTER MEDICATION Take 4 oz by mouth daily. Tart Cherry Juice  . pantoprazole (PROTONIX) 40 MG tablet TAKE 1 TABLET BY MOUTH ONCE A DAY  . Psyllium (METAMUCIL PO) Take 1 Dose by mouth 2 (two) times daily.   . Turmeric 500 MG CAPS Take 1,000 mg by mouth daily.  Marland Kitchen ZINC SULFATE PO Take 100 mg by mouth daily.   Marland Kitchen zolpidem (AMBIEN) 5 MG tablet Take 5 mg by mouth at bedtime as needed for sleep.     Allergies:   Bee  venom and Codeine   Social History   Socioeconomic History  . Marital status: Married    Spouse name: Not on file  . Number of children: Not on file  . Years of education: Not on file  . Highest education level: Not on file  Occupational History  . Not on file  Social Needs  . Financial resource strain: Not on file  . Food insecurity:    Worry: Not on file    Inability: Not on file  . Transportation needs:    Medical: Not on file    Non-medical: Not on file  Tobacco Use  . Smoking status: Former Research scientist (life sciences)  . Smokeless tobacco: Never Used  Substance and Sexual Activity  . Alcohol use: Yes    Alcohol/week: 0.0 standard drinks    Comment: occas  . Drug use: No  . Sexual activity: Not on file  Lifestyle  . Physical activity:    Days per week: Not on file     Minutes per session: Not on file  . Stress: Not on file  Relationships  . Social connections:    Talks on phone: Not on file    Gets together: Not on file    Attends religious service: Not on file    Active member of club or organization: Not on file    Attends meetings of clubs or organizations: Not on file    Relationship status: Not on file  Other Topics Concern  . Not on file  Social History Narrative  . Not on file     Family History: The patient's family history includes Diabetes Mellitus II in his father; Healthy in his daughter, daughter, and sister; Heart attack in his father, maternal grandfather, and paternal grandfather; Hypertension in his father.  ROS:   Please see the history of present illness.    Positive for back pain.  All other systems reviewed and are negative.  EKGs/Labs/Other Studies Reviewed:    The following studies were reviewed today: Echo 05/07/2017: Study Conclusions  - Left ventricle: The cavity size was normal. There was mild   concentric hypertrophy. Systolic function was vigorous. The   estimated ejection fraction was in the range of 65% to 70%. Wall   motion was normal; there were no regional wall motion   abnormalities. Doppler parameters are consistent with abnormal   left ventricular relaxation (grade 1 diastolic dysfunction).   There was no evidence of elevated ventricular filling pressure by   Doppler parameters. - Aortic valve: There was no regurgitation. - Aortic root: The aortic root was normal in size. - Mitral valve: Calcified annulus. Mildly thickened leaflets with   redundant chardae. There was trivial regurgitation. - Left atrium: The atrium was normal in size. - Right ventricle: The cavity size was normal. Wall thickness was   normal. Systolic function was normal. - Right atrium: The atrium was normal in size. - Tricuspid valve: There was trivial regurgitation. - Pulmonary arteries: Systolic pressure was within the normal    range. - Inferior vena cava: The vessel was normal in size. The   respirophasic diameter changes were in the normal range (= 50%),   consistent with normal central venous pressure. - Pericardium, extracardiac: There was no pericardial effusion.  EKG:  EKG is ordered today.  The ekg ordered today demonstrates normal sinus rhythm 61 bpm, within normal limits.  Recent Labs: No results found for requested labs within last 8760 hours.  Recent Lipid Panel    Component Value Date/Time  CHOL 176 01/19/2010 0924   TRIG 63.0 01/19/2010 0924   HDL 67.20 01/19/2010 0924   CHOLHDL 3 01/19/2010 0924   VLDL 12.6 01/19/2010 0924   LDLCALC 96 01/19/2010 0924    Physical Exam:    VS:  BP 138/70   Pulse 61   Ht 5\' 11"  (1.803 m)   Wt 192 lb 6.4 oz (87.3 kg)   SpO2 98%   BMI 26.83 kg/m     Wt Readings from Last 3 Encounters:  05/27/18 192 lb 6.4 oz (87.3 kg)  05/06/18 192 lb (87.1 kg)  11/01/17 192 lb 8 oz (87.3 kg)     GEN:  Well nourished, well developed in no acute distress HEENT: Normal NECK: No JVD; No carotid bruits LYMPHATICS: No lymphadenopathy CARDIAC: RRR, no murmurs, rubs, gallops RESPIRATORY:  Clear to auscultation without rales, wheezing or rhonchi  ABDOMEN: Soft, non-tender, non-distended MUSCULOSKELETAL:  No edema; No deformity  SKIN: Warm and dry NEUROLOGIC:  Alert and oriented x 3 PSYCHIATRIC:  Normal affect   ASSESSMENT:    1. Coronary artery disease involving native coronary artery of native heart without angina pectoris   2. Mixed hyperlipidemia   3. Essential hypertension    PLAN:    In order of problems listed above:  1. Stable without symptoms of angina.  Treated with aspirin and a high intensity statin drug.  Echocardiogram last year is normal. 2. Lipids are excellent.  Reviewed from Dr. Raul Del lab with total cholesterol 140, HDL 67, LDL 59, triglycerides 72.  Treated with a combination of atorvastatin and ezetimibe. 3. Blood pressure is controlled on  amlodipine 5 mg daily  The patient is doing very well from a cardiac perspective.  He follows closely with Dr. Brigitte Pulse.  I will see him back in 2 years unless problems arise.   Medication Adjustments/Labs and Tests Ordered: Current medicines are reviewed at length with the patient today.  Concerns regarding medicines are outlined above.  No orders of the defined types were placed in this encounter.  No orders of the defined types were placed in this encounter.   There are no Patient Instructions on file for this visit.   Signed, Sherren Mocha, MD  05/27/2018 8:24 AM    [place]

## 2018-06-14 DIAGNOSIS — G4733 Obstructive sleep apnea (adult) (pediatric): Secondary | ICD-10-CM | POA: Diagnosis not present

## 2018-07-15 DIAGNOSIS — G4733 Obstructive sleep apnea (adult) (pediatric): Secondary | ICD-10-CM | POA: Diagnosis not present

## 2018-08-14 DIAGNOSIS — G4733 Obstructive sleep apnea (adult) (pediatric): Secondary | ICD-10-CM | POA: Diagnosis not present

## 2018-10-04 DIAGNOSIS — R972 Elevated prostate specific antigen [PSA]: Secondary | ICD-10-CM | POA: Diagnosis not present

## 2018-10-04 IMAGING — CT CT BIOPSY AND ASPIRATION BONE MARROW
1 of 2 series · 15 of 30 positions shown, 19 images · non-contrast
Comparison: none

INDICATION: Erythrocytosis

[Series 2: i-spiral 5.0 b40f · axial · 0.96mm/px · z∈[-127,-29]mm · 15 of 32 slices shown, 19 images]
[im 2/32  mediastinal]
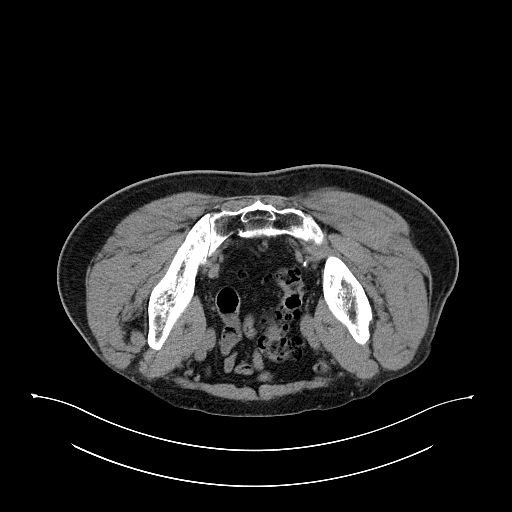
[im 2/32  lung]
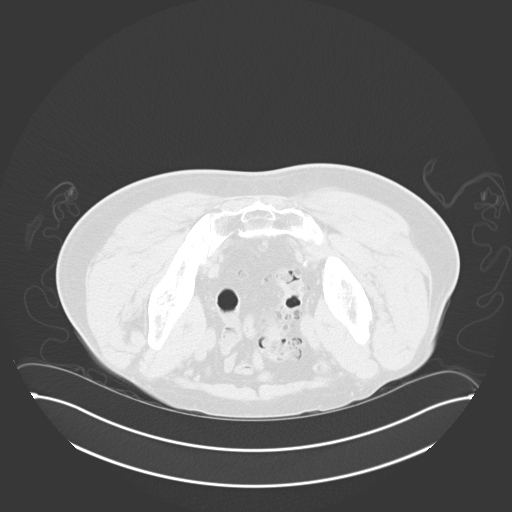
[im 5/32  lung]
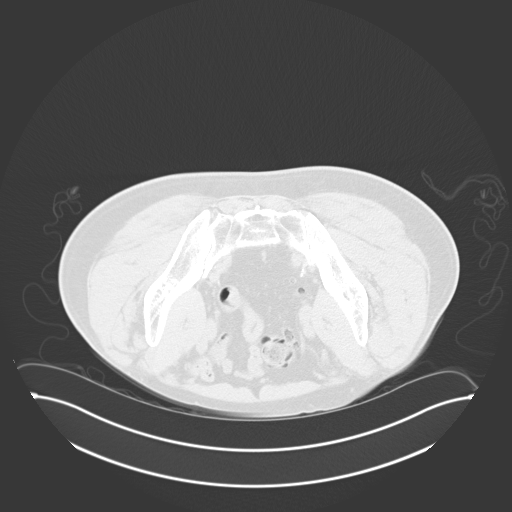
[im 7/32  lung]
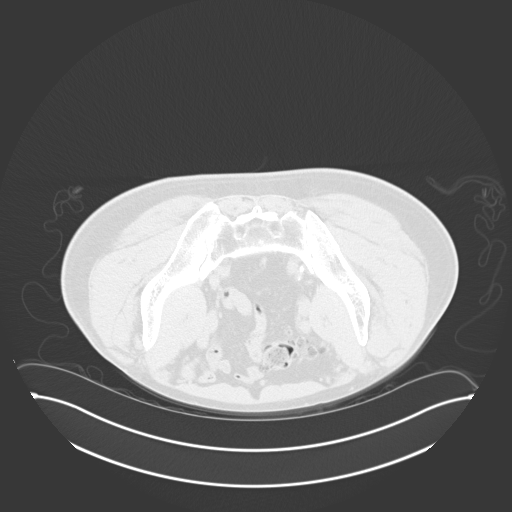
[im 9/32  lung]
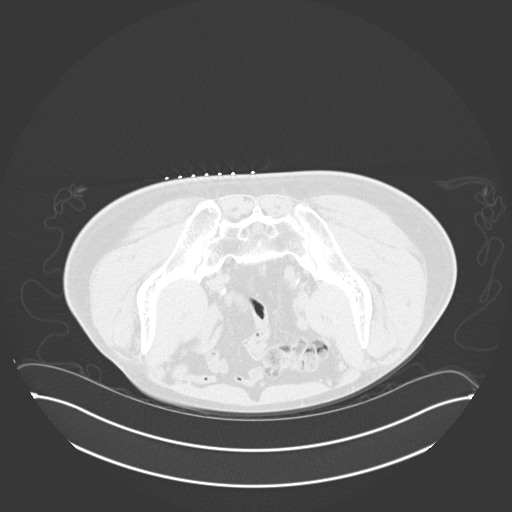
[im 11/32  mediastinal]
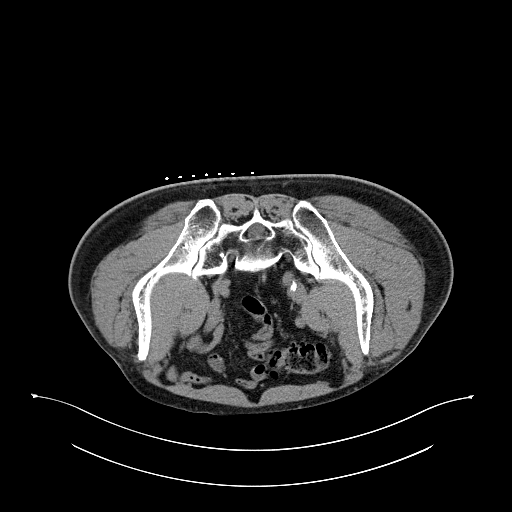
[im 11/32  lung]
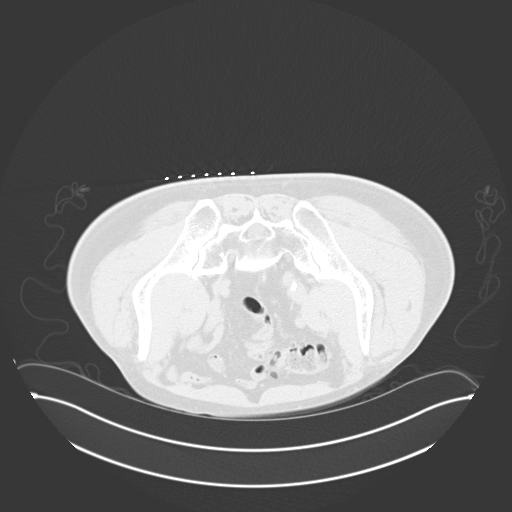
[im 12/32  lung]
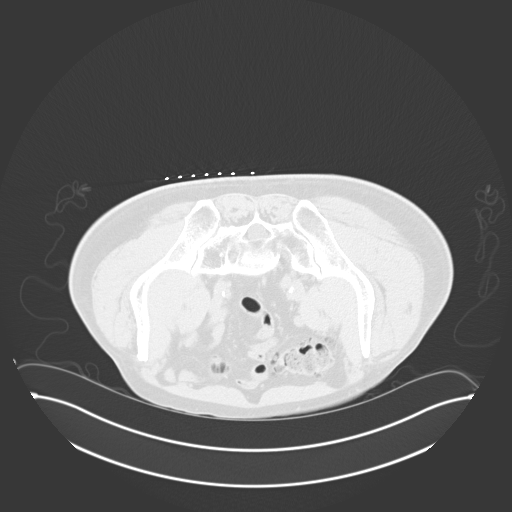
[im 14/32  lung]
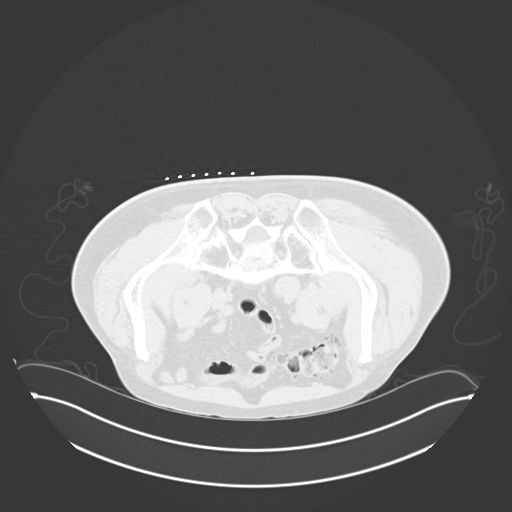
[im 17/32  lung]
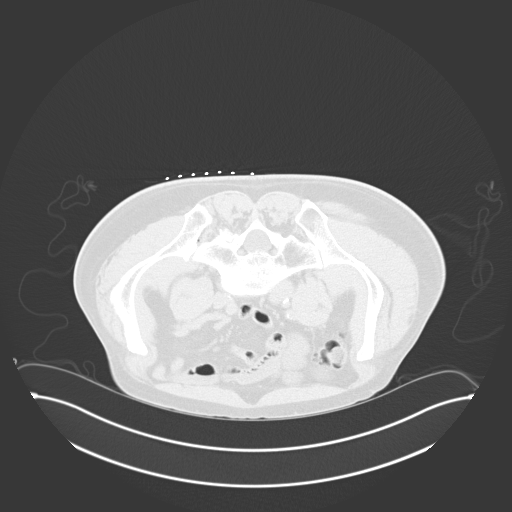
[im 18/32  mediastinal]
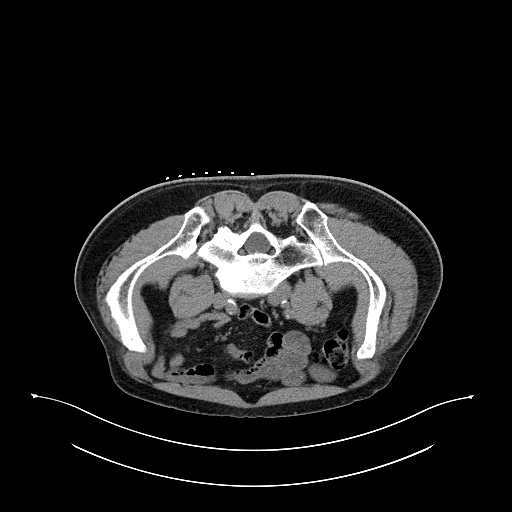
[im 18/32  lung]
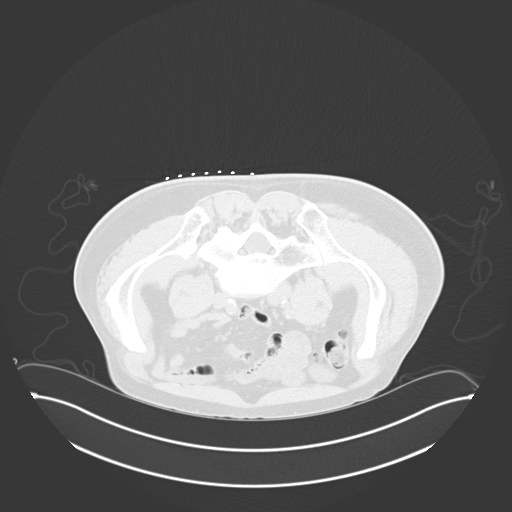
[im 20/32  lung]
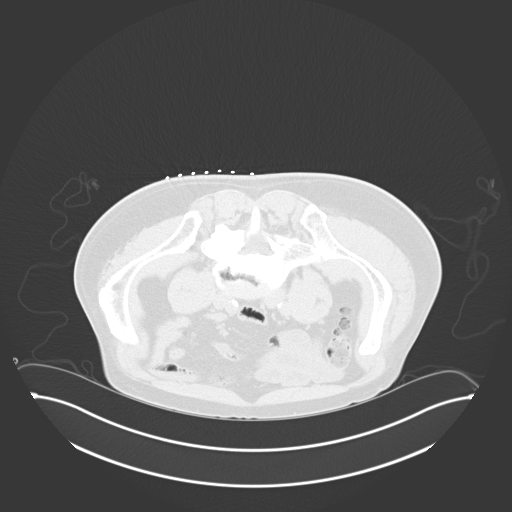
[im 22/32  lung]
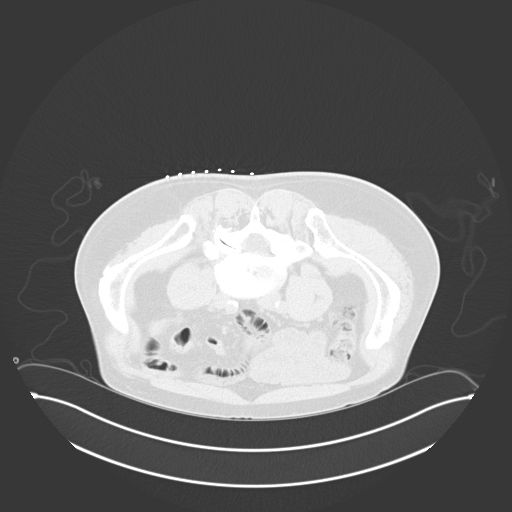
[im 23/32  lung]
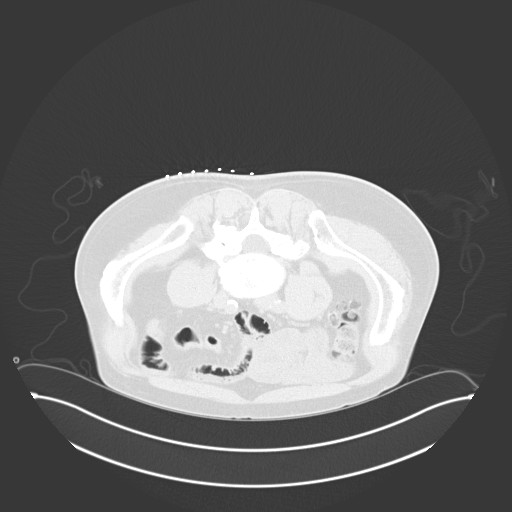
[im 25/32  mediastinal]
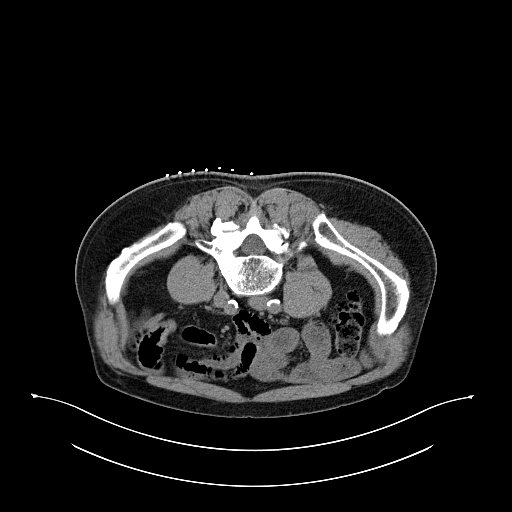
[im 25/32  lung]
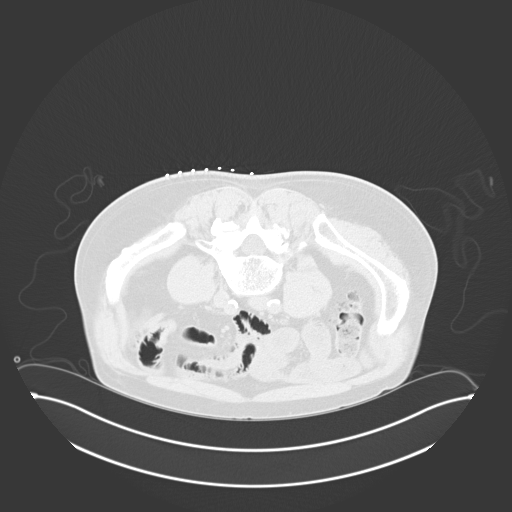
[im 28/32  lung]
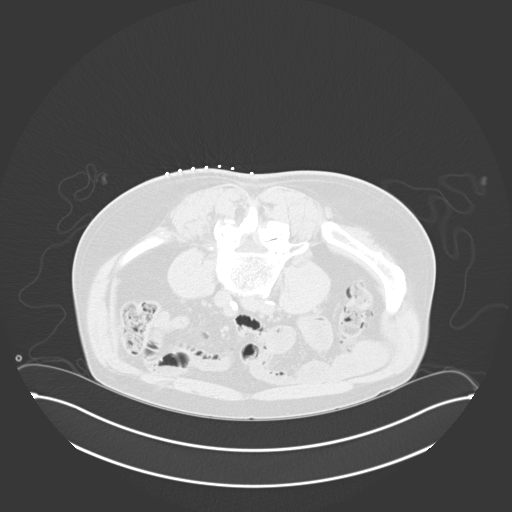
[im 30/32  lung]
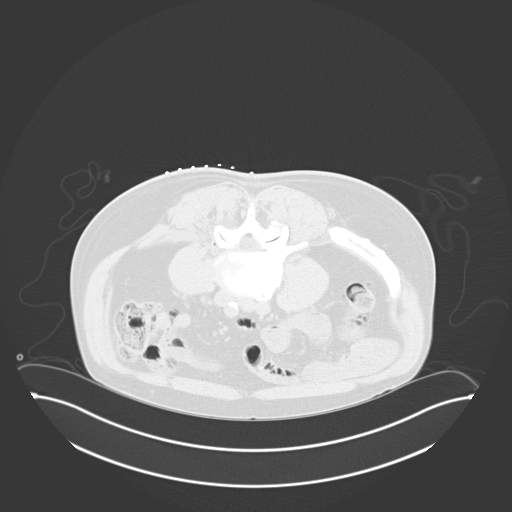

[15 of 30 positions shown; findings below may reference images not displayed]

EXAM:
CT BIOPSY; CT BONE MARROW BIOPSY AND ASPIRATION

MEDICATIONS:
None.

ANESTHESIA/SEDATION:
Fentanyl 100 mcg IV; Versed 2 mg IV

Moderate Sedation Time:  10 minutes

The patient was continuously monitored during the procedure by the
interventional radiology nurse under my direct supervision.

FLUOROSCOPY TIME:  Fluoroscopy Time:  minutes  seconds ( mGy).

COMPLICATIONS:
None immediate.

PROCEDURE:
Informed written consent was obtained from the patient after a
thorough discussion of the procedural risks, benefits and
alternatives. All questions were addressed. Maximal Sterile Barrier
Technique was utilized including caps, mask, sterile gowns, sterile
gloves, sterile drape, hand hygiene and skin antiseptic. A timeout
was performed prior to the initiation of the procedure.

Under CT guidance, a(n) 11 gauge guide needle was advanced into the
right iliac bone. Aspirates and a core were obtained. Post biopsy
images demonstrate no hemorrhage.

Patient tolerated the procedure well without complication. Vital
sign monitoring by nursing staff during the procedure will continue
as patient is in the special procedures unit for post procedure
observation.
FINDINGS: The images document guide needle placement within the right iliac
bone. Post biopsy images demonstrate no hemorrhage.
IMPRESSION: Successful bone marrow aspirate and core.

## 2018-10-11 DIAGNOSIS — R972 Elevated prostate specific antigen [PSA]: Secondary | ICD-10-CM | POA: Diagnosis not present

## 2018-10-31 DIAGNOSIS — Z85828 Personal history of other malignant neoplasm of skin: Secondary | ICD-10-CM | POA: Diagnosis not present

## 2018-10-31 DIAGNOSIS — I872 Venous insufficiency (chronic) (peripheral): Secondary | ICD-10-CM | POA: Diagnosis not present

## 2018-11-27 DIAGNOSIS — G4733 Obstructive sleep apnea (adult) (pediatric): Secondary | ICD-10-CM | POA: Diagnosis not present

## 2018-12-20 DIAGNOSIS — R7301 Impaired fasting glucose: Secondary | ICD-10-CM | POA: Diagnosis not present

## 2018-12-20 DIAGNOSIS — E7849 Other hyperlipidemia: Secondary | ICD-10-CM | POA: Diagnosis not present

## 2018-12-20 DIAGNOSIS — Z23 Encounter for immunization: Secondary | ICD-10-CM | POA: Diagnosis not present

## 2018-12-31 DIAGNOSIS — Z1339 Encounter for screening examination for other mental health and behavioral disorders: Secondary | ICD-10-CM | POA: Diagnosis not present

## 2018-12-31 DIAGNOSIS — G4733 Obstructive sleep apnea (adult) (pediatric): Secondary | ICD-10-CM | POA: Diagnosis not present

## 2018-12-31 DIAGNOSIS — I10 Essential (primary) hypertension: Secondary | ICD-10-CM | POA: Diagnosis not present

## 2018-12-31 DIAGNOSIS — R972 Elevated prostate specific antigen [PSA]: Secondary | ICD-10-CM | POA: Diagnosis not present

## 2018-12-31 DIAGNOSIS — Z9861 Coronary angioplasty status: Secondary | ICD-10-CM | POA: Diagnosis not present

## 2018-12-31 DIAGNOSIS — D751 Secondary polycythemia: Secondary | ICD-10-CM | POA: Diagnosis not present

## 2018-12-31 DIAGNOSIS — G4734 Idiopathic sleep related nonobstructive alveolar hypoventilation: Secondary | ICD-10-CM | POA: Diagnosis not present

## 2018-12-31 DIAGNOSIS — R7301 Impaired fasting glucose: Secondary | ICD-10-CM | POA: Diagnosis not present

## 2018-12-31 DIAGNOSIS — R809 Proteinuria, unspecified: Secondary | ICD-10-CM | POA: Diagnosis not present

## 2018-12-31 DIAGNOSIS — R82998 Other abnormal findings in urine: Secondary | ICD-10-CM | POA: Diagnosis not present

## 2018-12-31 DIAGNOSIS — Z0000 Encounter for general adult medical examination without abnormal findings: Secondary | ICD-10-CM | POA: Diagnosis not present

## 2018-12-31 DIAGNOSIS — Z125 Encounter for screening for malignant neoplasm of prostate: Secondary | ICD-10-CM | POA: Diagnosis not present

## 2018-12-31 DIAGNOSIS — K219 Gastro-esophageal reflux disease without esophagitis: Secondary | ICD-10-CM | POA: Diagnosis not present

## 2018-12-31 DIAGNOSIS — E785 Hyperlipidemia, unspecified: Secondary | ICD-10-CM | POA: Diagnosis not present

## 2018-12-31 DIAGNOSIS — Z1331 Encounter for screening for depression: Secondary | ICD-10-CM | POA: Diagnosis not present

## 2018-12-31 DIAGNOSIS — J309 Allergic rhinitis, unspecified: Secondary | ICD-10-CM | POA: Diagnosis not present

## 2019-01-10 DIAGNOSIS — Z1212 Encounter for screening for malignant neoplasm of rectum: Secondary | ICD-10-CM | POA: Diagnosis not present

## 2019-04-22 ENCOUNTER — Ambulatory Visit: Payer: PPO

## 2019-05-01 ENCOUNTER — Ambulatory Visit: Payer: PPO

## 2019-05-09 ENCOUNTER — Ambulatory Visit: Payer: PPO

## 2019-05-12 ENCOUNTER — Other Ambulatory Visit: Payer: Self-pay

## 2019-05-12 ENCOUNTER — Ambulatory Visit: Payer: Medicare Other | Admitting: Adult Health

## 2019-05-12 ENCOUNTER — Encounter: Payer: Self-pay | Admitting: Adult Health

## 2019-05-12 VITALS — BP 124/68 | HR 65 | Temp 97.5°F | Ht 71.0 in | Wt 190.6 lb

## 2019-05-12 DIAGNOSIS — Z9989 Dependence on other enabling machines and devices: Secondary | ICD-10-CM

## 2019-05-12 DIAGNOSIS — G4733 Obstructive sleep apnea (adult) (pediatric): Secondary | ICD-10-CM | POA: Diagnosis not present

## 2019-05-12 NOTE — Patient Instructions (Signed)
Continue using CPAP nightly and greater than 4 hours each night °If your symptoms worsen or you develop new symptoms please let us know.  ° °

## 2019-05-12 NOTE — Progress Notes (Signed)
PATIENT: Nicholas Hernandez DOB: August 28, 1945  REASON FOR VISIT: follow up HISTORY FROM: patient  HISTORY OF PRESENT ILLNESS: Today 05/12/19:  Mr. Malay is a 74 year old male with a history of obstructive sleep apnea on CPAP.  His download indicates that he use his machine 30 out of 30 days for compliance of 100%.  He uses machine greater than 4 hours 29 days for compliance of 97%.  On average he uses his machine 6 hours and 53 minutes.  His residual AHI is 1.1 on 5 to 13 cm of water with EPR of three.  His leak in the 95th percentile is 7.6.  He reports that the CPAP is working well.  He returns today for an evaluation  HISTORY 05/06/2018: I reviewed his AutoPap compliance data from 04/02/2018 through  which is a total of 30 days, during which time he used his AutoPap every night with percent used days greater than 4 hours at 97%, indicating excellent compliance with an average usage of 7 hours and 18 minutes, residual AHI at goal at 1.4 per hour, 95th percentile of pressure at 9.8 cm, leak acceptable with the 95th percentile at 11.8 L/m on a pressure range of 5 cm to 13 cm with EPR of 3. He reports doing well, sleep is good on AutoPap therapy, he is compliant with treatment and indicates ongoing good results. He is typically up-to-date with supplies, has been using medium nasal pillows, his mindful about changing his filter on a monthly basis.    REVIEW OF SYSTEMS: Out of a complete 14 system review of symptoms, the patient complains only of the following symptoms, and all other reviewed systems are negative.  ESS 2 FSS 20  ALLERGIES: Allergies  Allergen Reactions  . Bee Venom Swelling  . Codeine Nausea And Vomiting    HOME MEDICATIONS: Outpatient Medications Prior to Visit  Medication Sig Dispense Refill  . aspirin 81 MG tablet Take 81 mg by mouth daily.      . cetirizine (ZYRTEC) 10 MG tablet Take 10 mg by mouth daily.    Marland Kitchen ezetimibe (ZETIA) 10 MG tablet Take 10 mg by mouth daily.     . fluticasone (FLONASE) 50 MCG/ACT nasal spray HOLD RIGHT NOW    . Ginkgo Biloba (GINKOBA) 40 MG TABS Take 40 mg by mouth daily.     Marland Kitchen LIPITOR 40 MG tablet TAKE ONE TABLET BY MOUTH DAILY. 30 tablet 2  . losartan (COZAAR) 100 MG tablet Take 100 mg by mouth daily.    . nitroGLYCERIN (NITROSTAT) 0.4 MG SL tablet Place 0.4 mg under the tongue every 5 (five) minutes as needed for chest pain (chest pain).    Marland Kitchen OVER THE COUNTER MEDICATION Take 4 oz by mouth daily. Tart Cherry Juice    . pantoprazole (PROTONIX) 40 MG tablet TAKE 1 TABLET BY MOUTH ONCE A DAY 30 tablet 5  . Psyllium (METAMUCIL PO) Take 1 Dose by mouth 2 (two) times daily.     Marland Kitchen ZINC SULFATE PO Take 100 mg by mouth daily.     Marland Kitchen zolpidem (AMBIEN) 5 MG tablet Take 5 mg by mouth at bedtime as needed for sleep.    Marland Kitchen amLODipine (NORVASC) 5 MG tablet Take 5 mg by mouth daily.    . Turmeric 500 MG CAPS Take 1,000 mg by mouth daily.     No facility-administered medications prior to visit.    PAST MEDICAL HISTORY: Past Medical History:  Diagnosis Date  . Allergic rhinitis   .  CAD (coronary artery disease)    post 3.5 x 37m xience durg-eluting stent to distal right coronary artery  . Chronic LBP   . Esophageal stricture    status post dilatation 10-08-08  . Essential hypertension, benign   . External hemorrhoids   . GERD (gastroesophageal reflux disease)   . History of esophageal stricture   . HLD (hyperlipidemia)   . Impaired fasting glucose   . Shingles     PAST SURGICAL HISTORY: Past Surgical History:  Procedure Laterality Date  . APPENDECTOMY    . right inguinal herniorrhaphy    . TONSILLECTOMY      FAMILY HISTORY: Family History  Problem Relation Age of Onset  . Heart attack Father        MI at [phone] . Hypertension Father   . Diabetes Mellitus II Father   . Heart attack Paternal Grandfather   . Heart attack Maternal Grandfather   . Healthy Sister   . Healthy Daughter   . Healthy Daughter     SOCIAL  HISTORY: Social History   Socioeconomic History  . Marital status: Married    Spouse name: Not on file  . Number of children: Not on file  . Years of education: Not on file  . Highest education level: Not on file  Occupational History  . Not on file  Tobacco Use  . Smoking status: Former SResearch scientist (life sciences) . Smokeless tobacco: Never Used  Substance and Sexual Activity  . Alcohol use: Yes    Alcohol/week: 0.0 standard drinks    Comment: occas  . Drug use: No  . Sexual activity: Not on file  Other Topics Concern  . Not on file  Social History Narrative  . Not on file   Social Determinants of Health   Financial Resource Strain:   . Difficulty of Paying Living Expenses: Not on file  Food Insecurity:   . Worried About RCharity fundraiserin the Last Year: Not on file  . Ran Out of Food in the Last Year: Not on file  Transportation Needs:   . Lack of Transportation (Medical): Not on file  . Lack of Transportation (Non-Medical): Not on file  Physical Activity:   . Days of Exercise per Week: Not on file  . Minutes of Exercise per Session: Not on file  Stress:   . Feeling of Stress : Not on file  Social Connections:   . Frequency of Communication with Friends and Family: Not on file  . Frequency of Social Gatherings with Friends and Family: Not on file  . Attends Religious Services: Not on file  . Active Member of Clubs or Organizations: Not on file  . Attends CArchivistMeetings: Not on file  . Marital Status: Not on file  Intimate Partner Violence:   . Fear of Current or Ex-Partner: Not on file  . Emotionally Abused: Not on file  . Physically Abused: Not on file  . Sexually Abused: Not on file      PHYSICAL EXAM  Vitals:   05/12/19 1054  BP: 124/68  Pulse: 65  Temp: (!) 97.5 F (36.4 C)  Weight: 190 lb 9.6 oz (86.5 kg)  Height: '5\' 11"'$  (1.803 m)   Body mass index is 26.58 kg/m.  Generalized: Well developed, in no acute distress  Chest: Lungs clear to  auscultation bilaterally  Neurological examination  Mentation: Alert oriented to time, place, history taking. Follows all commands speech and language fluent Cranial nerve II-XII: Extraocular movements  were full, visual field were full on confrontational test Head turning and shoulder shrug  were normal and symmetric. Motor: The motor testing reveals 5 over 5 strength of all 4 extremities. Good symmetric motor tone is noted throughout.  Sensory: Sensory testing is intact to soft touch on all 4 extremities. No evidence of extinction is noted.  Gait and station: Gait is normal.    DIAGNOSTIC DATA (LABS, IMAGING, TESTING) - I reviewed patient records, labs, notes, testing and imaging myself where available.  Lab Results  Component Value Date   WBC 9.4 04/12/2017   HGB 18.0 (H) 04/12/2017   HCT 53.1 (H) 04/12/2017   MCV 93.5 04/12/2017   PLT 231 04/12/2017      Component Value Date/Time   NA 141 02/27/2017 1222   K 4.0 02/27/2017 1222   CL 104 10/10/2008 0635   CO2 26 02/27/2017 1222   GLUCOSE 109 02/27/2017 1222   BUN 14.5 02/27/2017 1222   CREATININE 1.1 02/27/2017 1222   CALCIUM 10.0 02/27/2017 1222   PROT 8.0 02/27/2017 1222   ALBUMIN 4.4 02/27/2017 1222   AST 27 02/27/2017 1222   ALT 23 02/27/2017 1222   ALKPHOS 137 02/27/2017 1222   BILITOT 1.83 (H) 02/27/2017 1222   GFRNONAA >60 10/10/2008 0635   GFRAA  10/10/2008 0635    >60        The eGFR has been calculated using the MDRD equation. This calculation has not been validated in all clinical situations. eGFR's persistently <60 mL/min signify possible Chronic Kidney Disease.   Lab Results  Component Value Date   CHOL 176 01/19/2010   HDL 67.20 01/19/2010   LDLCALC 96 01/19/2010   TRIG 63.0 01/19/2010   CHOLHDL 3 01/19/2010      ASSESSMENT AND PLAN 74 y.o. year old male  has a past medical history of Allergic rhinitis, CAD (coronary artery disease), Chronic LBP, Esophageal stricture, Essential  hypertension, benign, External hemorrhoids, GERD (gastroesophageal reflux disease), History of esophageal stricture, HLD (hyperlipidemia), Impaired fasting glucose, and Shingles. here with :  1. Obstructive sleep apnea on CPAP  The patient's CPAP download shows excellent compliance and good treatment of his apnea.  He is encouraged to continue using CPAP nightly and greater than 4 hours each night.  He is advised that if his symptoms worsen or he develops new symptoms he should let us know.  He will follow-up in 1 year or sooner if needed    I spent 15 minutes with the patient. 50% of this time was spent reviewing CPAP download   Ward Givens, MSN, NP-C 05/12/2019, 11:10 AM [facility] [address], [place], [place] [address] [phone]

## 2020-05-17 ENCOUNTER — Ambulatory Visit: Payer: Medicare Other | Admitting: Adult Health

## 2020-06-07 ENCOUNTER — Ambulatory Visit: Payer: Medicare Other | Admitting: Adult Health

## 2020-06-07 ENCOUNTER — Encounter: Payer: Self-pay | Admitting: Adult Health

## 2020-06-07 ENCOUNTER — Other Ambulatory Visit: Payer: Self-pay

## 2020-06-07 VITALS — BP 160/90 | HR 57 | Ht 71.0 in | Wt 190.0 lb

## 2020-06-07 DIAGNOSIS — Z9989 Dependence on other enabling machines and devices: Secondary | ICD-10-CM

## 2020-06-07 DIAGNOSIS — G4733 Obstructive sleep apnea (adult) (pediatric): Secondary | ICD-10-CM

## 2020-06-07 NOTE — Patient Instructions (Signed)
Continue using CPAP nightly and greater than 4 hours each night °If your symptoms worsen or you develop new symptoms please let us know.  ° °

## 2020-06-07 NOTE — Progress Notes (Addendum)
PATIENT: Nicholas Hernandez DOB: Jul 13, 1945  REASON FOR VISIT: follow up HISTORY FROM: patient  HISTORY OF PRESENT ILLNESS: Today 06/07/20:  Nicholas Hernandez is a 75 year old male with a history of obstructive sleep apnea on CPAP.  He returns today for follow-up.  He reports that the CPAP is working well for him.  He denies any new symptoms.  His download is below    HISTORY 05/12/19:  Nicholas Hernandez is a 75 year old male with a history of obstructive sleep apnea on CPAP.  His download indicates that he use his machine 30 out of 30 days for compliance of 100%.  He uses machine greater than 4 hours 29 days for compliance of 97%.  On average he uses his machine 6 hours and 53 minutes.  His residual AHI is 1.1 on 5 to 13 cm of water with EPR of three.  His leak in the 95th percentile is 7.6.  He reports that the CPAP is working well.  He returns today for an evaluation  REVIEW OF SYSTEMS: Out of a complete 14 system review of symptoms, the patient complains only of the following symptoms, and all other reviewed systems are negative.  FSS 9 ESS 2  ALLERGIES: Allergies  Allergen Reactions  . Bee Venom Swelling  . Codeine Nausea And Vomiting    HOME MEDICATIONS: Outpatient Medications Prior to Visit  Medication Sig Dispense Refill  . aspirin 81 MG tablet Take 81 mg by mouth daily.    . cetirizine (ZYRTEC) 10 MG tablet Take 10 mg by mouth daily.    Marland Kitchen ezetimibe (ZETIA) 10 MG tablet Take 10 mg by mouth daily.    . fluticasone (FLONASE) 50 MCG/ACT nasal spray HOLD RIGHT NOW    . Ginkgo Biloba 40 MG TABS Take 40 mg by mouth daily.    Marland Kitchen LIPITOR 40 MG tablet TAKE ONE TABLET BY MOUTH DAILY. 30 tablet 2  . losartan (COZAAR) 100 MG tablet Take 100 mg by mouth daily.    . meloxicam (MOBIC) 15 MG tablet Take 15 mg by mouth as needed.    . nitroGLYCERIN (NITROSTAT) 0.4 MG SL tablet Place 0.4 mg under the tongue every 5 (five) minutes as needed for chest pain (chest pain).    . pantoprazole (PROTONIX) 40 MG  tablet TAKE 1 TABLET BY MOUTH ONCE A DAY 30 tablet 5  . Psyllium (METAMUCIL PO) Take 1 Dose by mouth 2 (two) times daily.     Marland Kitchen ZINC SULFATE PO Take 100 mg by mouth daily.    Marland Kitchen zolpidem (AMBIEN) 5 MG tablet Take 5 mg by mouth at bedtime as needed for sleep.    Marland Kitchen OVER THE COUNTER MEDICATION Take 4 oz by mouth daily. Tart Cherry Juice     No facility-administered medications prior to visit.    PAST MEDICAL HISTORY: Past Medical History:  Diagnosis Date  . Allergic rhinitis   . CAD (coronary artery disease)    post 3.5 x 22mm xience durg-eluting stent to distal right coronary artery  . Chronic LBP   . Esophageal stricture    status post dilatation 10-08-08  . Essential hypertension, benign   . External hemorrhoids   . GERD (gastroesophageal reflux disease)   . History of esophageal stricture   . HLD (hyperlipidemia)   . Impaired fasting glucose   . Shingles     PAST SURGICAL HISTORY: Past Surgical History:  Procedure Laterality Date  . APPENDECTOMY    . right inguinal herniorrhaphy    .  TONSILLECTOMY      FAMILY HISTORY: Family History  Problem Relation Age of Onset  . Heart attack Father        MI at 72, 10  . Hypertension Father   . Diabetes Mellitus II Father   . Heart attack Paternal Grandfather   . Heart attack Maternal Grandfather   . Healthy Sister   . Healthy Daughter   . Healthy Daughter     SOCIAL HISTORY: Social History   Socioeconomic History  . Marital status: Married    Spouse name: Not on file  . Number of children: Not on file  . Years of education: Not on file  . Highest education level: Not on file  Occupational History  . Not on file  Tobacco Use  . Smoking status: Former Research scientist (life sciences)  . Smokeless tobacco: Never Used  Vaping Use  . Vaping Use: Never used  Substance and Sexual Activity  . Alcohol use: Yes    Alcohol/week: 0.0 standard drinks    Comment: occas  . Drug use: No  . Sexual activity: Not on file  Other Topics Concern  . Not on  file  Social History Narrative  . Not on file   Social Determinants of Health   Financial Resource Strain: Not on file  Food Insecurity: Not on file  Transportation Needs: Not on file  Physical Activity: Not on file  Stress: Not on file  Social Connections: Not on file  Intimate Partner Violence: Not on file      PHYSICAL EXAM  Vitals:   06/07/20 0948 06/07/20 1024  BP: (!) 170/69 (!) 160/90  Pulse: (!) 57   Weight: 190 lb (86.2 kg)   Height: $Remove'5\' 11"'[id number]$  (1.803 m)    Body mass index is 26.5 kg/m.  Generalized: Well developed, in no acute distress  Chest: Lungs clear to auscultation bilaterally  Neurological examination  Mentation: Alert oriented to time, place, history taking. Follows all commands speech and language fluent Cranial nerve II-XII: Extraocular movements were full, visual field were full on confrontational test Head turning and shoulder shrug  were normal and symmetric. Motor: The motor testing reveals 5 over 5 strength of all 4 extremities. Good symmetric motor tone is noted throughout.  Sensory: Sensory testing is intact to soft touch on all 4 extremities. No evidence of extinction is noted.  Gait and station: Gait is normal.    DIAGNOSTIC DATA (LABS, IMAGING, TESTING) - I reviewed patient records, labs, notes, testing and imaging myself where available.  Lab Results  Component Value Date   WBC 9.4 04/12/2017   HGB 18.0 (H) 04/12/2017   HCT 53.1 (H) 04/12/2017   MCV 93.5 04/12/2017   PLT 231 04/12/2017      Component Value Date/Time   NA 141 02/27/2017 1222   K 4.0 02/27/2017 1222   CL 104 10/10/2008 0635   CO2 26 02/27/2017 1222   GLUCOSE 109 02/27/2017 1222   BUN 14.5 02/27/2017 1222   CREATININE 1.1 02/27/2017 1222   CALCIUM 10.0 02/27/2017 1222   PROT 8.0 02/27/2017 1222   ALBUMIN 4.4 02/27/2017 1222   AST 27 02/27/2017 1222   ALT 23 02/27/2017 1222   ALKPHOS 137 02/27/2017 1222   BILITOT 1.83 (H) 02/27/2017 1222   GFRNONAA >60  10/10/2008 0635   GFRAA  10/10/2008 0635    >60        The eGFR has been calculated using the MDRD equation. This calculation has not been validated in all clinical situations. eGFR's  persistently <60 mL/min signify possible Chronic Kidney Disease.   Lab Results  Component Value Date   CHOL 176 01/19/2010   HDL 67.20 01/19/2010   LDLCALC 96 01/19/2010   TRIG 63.0 01/19/2010   CHOLHDL 3 01/19/2010     ASSESSMENT AND PLAN 75 y.o. year old male  has a past medical history of Allergic rhinitis, CAD (coronary artery disease), Chronic LBP, Esophageal stricture, Essential hypertension, benign, External hemorrhoids, GERD (gastroesophageal reflux disease), History of esophageal stricture, HLD (hyperlipidemia), Impaired fasting glucose, and Shingles. here with:  1. OSA on CPAP  - CPAP compliance excellent - Good treatment of AHI  - Encourage patient to use CPAP nightly and > 4 hours each night - F/U in 1 year or sooner if needed   I spent 20 minutes of face-to-face and non-face-to-face time with patient.  This included previsit chart review, lab review, study review, order entry, electronic health record documentation, patient education.  Ward Givens, MSN, NP-C 06/07/2020, 10:00 AM [facility] [address] [place], [place] [address] [phone]  I reviewed the above note and documentation by the Nurse Practitioner and agree with the history, exam, assessment and plan as outlined above. I was available for consultation. Star Age, MD, PhD [facility])

## 2020-06-30 ENCOUNTER — Ambulatory Visit (INDEPENDENT_AMBULATORY_CARE_PROVIDER_SITE_OTHER): Payer: Medicare Other | Admitting: Cardiovascular Disease

## 2020-06-30 ENCOUNTER — Other Ambulatory Visit: Payer: Self-pay

## 2020-06-30 ENCOUNTER — Encounter: Payer: Self-pay | Admitting: Cardiovascular Disease

## 2020-06-30 VITALS — BP 144/80 | HR 64 | Ht 71.0 in | Wt 190.4 lb

## 2020-06-30 DIAGNOSIS — I10 Essential (primary) hypertension: Secondary | ICD-10-CM

## 2020-06-30 DIAGNOSIS — E782 Mixed hyperlipidemia: Secondary | ICD-10-CM

## 2020-06-30 DIAGNOSIS — I2510 Atherosclerotic heart disease of native coronary artery without angina pectoris: Secondary | ICD-10-CM | POA: Diagnosis not present

## 2020-06-30 NOTE — Progress Notes (Signed)
Cardiology Office Note:    Date:  06/30/2020   ID:  Iniko, Robles 1946-03-14, MRN [id number]  PCP:  Ginger Organ., MD   [facility]  Cardiologist:  Sherren Mocha, MD  Advanced Practice Provider:  No care team member to display Electrophysiologist:  None       Referring MD: Ginger Organ., MD   Chief Complaint  Patient presents with  . Coronary Artery Disease    History of Present Illness:    Nicholas Hernandez is a 75 y.o. male with a hx of coronary artery disease, presenting for follow-up evaluation.  He was last seen in outpatient follow-up in March 2020.  He presented in 2010 with unstable angina and underwent stenting of the right coronary artery.  He has done very well since that time with no recurrent anginal symptoms.  Today, the patient is here alone.  He denies symptoms of chest pain, chest pressure, or shortness of breath.  He brings in a copy of his blood work which shows a cholesterol of 170, HDL 102, and LDL 58.  His hemoglobin A1c is 5.5.  Creatinine is 1.2.  He walks 18 holes of golf regularly and also does some exercise at the [facility].  He has no exertional symptoms.  Past Medical History:  Diagnosis Date  . Allergic rhinitis   . CAD (coronary artery disease)    post 3.5 x 6mm xience durg-eluting stent to distal right coronary artery  . Chronic LBP   . Esophageal stricture    status post dilatation 10-08-08  . Essential hypertension, benign   . External hemorrhoids   . GERD (gastroesophageal reflux disease)   . History of esophageal stricture   . HLD (hyperlipidemia)   . Impaired fasting glucose   . Shingles     Past Surgical History:  Procedure Laterality Date  . APPENDECTOMY    . right inguinal herniorrhaphy    . TONSILLECTOMY      Current Medications: Current Meds  Medication Sig  . aspirin 81 MG tablet Take 81 mg by mouth daily.  . cetirizine (ZYRTEC) 10 MG tablet Take 10 mg by mouth daily.  Marland Kitchen ezetimibe (ZETIA)  10 MG tablet Take 10 mg by mouth daily.  . fluticasone (FLONASE) 50 MCG/ACT nasal spray HOLD RIGHT NOW  . Ginkgo Biloba 40 MG TABS Take 40 mg by mouth daily.  Marland Kitchen LIPITOR 40 MG tablet TAKE ONE TABLET BY MOUTH DAILY.  Marland Kitchen losartan (COZAAR) 100 MG tablet Take 100 mg by mouth daily.  . meloxicam (MOBIC) 15 MG tablet Take 15 mg by mouth as needed.  . nitroGLYCERIN (NITROSTAT) 0.4 MG SL tablet Place 0.4 mg under the tongue every 5 (five) minutes as needed for chest pain (chest pain).  . pantoprazole (PROTONIX) 40 MG tablet Take 40 mg by mouth as needed.  . Psyllium (METAMUCIL PO) Take 1 Dose by mouth 2 (two) times daily.   Marland Kitchen ZINC SULFATE PO Take 100 mg by mouth daily.  Marland Kitchen zolpidem (AMBIEN) 5 MG tablet Take 5 mg by mouth at bedtime as needed for sleep.     Allergies:   Bee venom and Codeine   Social History   Socioeconomic History  . Marital status: Married    Spouse name: Not on file  . Number of children: Not on file  . Years of education: Not on file  . Highest education level: Not on file  Occupational History  . Not on file  Tobacco Use  . Smoking status: Former Research scientist (life sciences)  . Smokeless tobacco: Never Used  Vaping Use  . Vaping Use: Never used  Substance and Sexual Activity  . Alcohol use: Yes    Alcohol/week: 0.0 standard drinks    Comment: occas  . Drug use: No  . Sexual activity: Not on file  Other Topics Concern  . Not on file  Social History Narrative  . Not on file   Social Determinants of Health   Financial Resource Strain: Not on file  Food Insecurity: Not on file  Transportation Needs: Not on file  Physical Activity: Not on file  Stress: Not on file  Social Connections: Not on file     Family History: The patient's family history includes Diabetes Mellitus II in his father; Healthy in his daughter, daughter, and sister; Heart attack in his father, maternal grandfather, and paternal grandfather; Hypertension in his father.  ROS:   Please see the history of present  illness.    All other systems reviewed and are negative.  EKGs/Labs/Other Studies Reviewed:    EKG:  EKG is ordered today.  The ekg ordered today demonstrates normal sinus rhythm 64 bpm, within normal limits.  Recent Labs: No results found for requested labs within last 8760 hours.  Recent Lipid Panel    Component Value Date/Time   CHOL 176 01/19/2010 0924   TRIG 63.0 01/19/2010 0924   HDL 67.20 01/19/2010 0924   CHOLHDL 3 01/19/2010 0924   VLDL 12.6 01/19/2010 0924   LDLCALC 96 01/19/2010 0924     Risk Assessment/Calculations:       Physical Exam:    VS:  BP (!) 144/80   Pulse 64   Ht 5\' 11"  (1.803 m)   Wt 190 lb 6.4 oz (86.4 kg)   SpO2 99%   BMI 26.56 kg/m     Wt Readings from Last 3 Encounters:  06/30/20 190 lb 6.4 oz (86.4 kg)  06/07/20 190 lb (86.2 kg)  05/12/19 190 lb 9.6 oz (86.5 kg)     GEN:  Well nourished, well developed in no acute distress HEENT: Normal NECK: No JVD; No carotid bruits LYMPHATICS: No lymphadenopathy CARDIAC: RRR, no murmurs, rubs, gallops RESPIRATORY:  Clear to auscultation without rales, wheezing or rhonchi  ABDOMEN: Soft, non-tender, non-distended MUSCULOSKELETAL:  No edema; No deformity  SKIN: Warm and dry NEUROLOGIC:  Alert and oriented x 3 PSYCHIATRIC:  Normal affect   ASSESSMENT:    1. Atherosclerosis of native coronary artery of native heart without angina pectoris   2. Mixed hyperlipidemia   3. Essential hypertension    PLAN:    In order of problems listed above:  1. The patient continues to do well.  His medical program is reviewed includes aspirin and a high intensity statin drug.  His anginal equivalent is left shoulder pain and he has had no recurrence since he underwent PCI in 2010. 2. Lipids reviewed as above.  Treated with atorvastatin.  LFTs are normal. 3. Blood pressure is borderline.  He has been keeping an eye on this at home and at the [facility].  Blood pressure yesterday was 138/75.  He reported leg swelling  with amlodipine a few years ago.  He continues on losartan and will keep an eye on his home blood pressure readings.  He is followed closely by Dr. Brigitte Pulse.  I will plan on seeing him Mr. Calvin back in 2 years for follow-up.        Medication Adjustments/Labs and Tests Ordered:  Current medicines are reviewed at length with the patient today.  Concerns regarding medicines are outlined above.  Orders Placed This Encounter  Procedures  . EKG 12-Lead   No orders of the defined types were placed in this encounter.   Patient Instructions  Your provider recommends that you continue on your current medications as directed. Please refer to the Current Medication list given to you today.   *If you need a refill on your cardiac medications before your next appointment, please call your pharmacy*  Follow-Up: At [facility], you and your health needs are our priority.  As part of our continuing mission to provide you with exceptional heart care, we have created designated Provider Care Teams.  These Care Teams include your primary Cardiologist (physician) and Advanced Practice Providers (APPs -  Physician Assistants and Nurse Practitioners) who all work together to provide you with the care you need, when you need it. Your next appointment:   2 year(s) The format for your next appointment:   In Person Provider:   You may see Sherren Mocha, MD or one of the following Advanced Practice Providers on your designated Care Team:    Richardson Dopp, PA-C  Robbie Lis, [place]      Signed, Sherren Mocha, MD  06/30/2020 9:47 AM    [place]

## 2020-06-30 NOTE — Patient Instructions (Signed)
Your provider recommends that you continue on your current medications as directed. Please refer to the Current Medication list given to you today.   *If you need a refill on your cardiac medications before your next appointment, please call your pharmacy*  Follow-Up: At [facility], you and your health needs are our priority.  As part of our continuing mission to provide you with exceptional heart care, we have created designated Provider Care Teams.  These Care Teams include your primary Cardiologist (physician) and Advanced Practice Providers (APPs -  Physician Assistants and Nurse Practitioners) who all work together to provide you with the care you need, when you need it. Your next appointment:   2 year(s) The format for your next appointment:   In Person Provider:   You may see Sherren Mocha, MD or one of the following Advanced Practice Providers on your designated Care Team:    Richardson Dopp, PA-C  [place]in [place], [place]

## 2020-08-24 ENCOUNTER — Ambulatory Visit: Payer: Medicare Other | Admitting: Family Medicine

## 2020-08-24 ENCOUNTER — Ambulatory Visit: Payer: Self-pay

## 2020-08-24 ENCOUNTER — Other Ambulatory Visit: Payer: Self-pay

## 2020-08-24 ENCOUNTER — Encounter: Payer: Self-pay | Admitting: Family Medicine

## 2020-08-24 VITALS — BP 110/74 | Ht 71.0 in | Wt 190.0 lb

## 2020-08-24 DIAGNOSIS — M25851 Other specified joint disorders, right hip: Secondary | ICD-10-CM | POA: Diagnosis not present

## 2020-08-24 DIAGNOSIS — M25551 Pain in right hip: Secondary | ICD-10-CM

## 2020-08-24 MED ORDER — PREDNISONE 5 MG PO TABS: ORAL_TABLET | ORAL | 0 refills | Status: DC

## 2020-08-24 NOTE — Assessment & Plan Note (Signed)
Seem to have a mild bursitis overlying the adductor longus but could be secondary to his impingement.  Only occurs with transitioning from sitting to standing.  No significant effusion of the hip but labral degenerative changes were appreciated on ultrasound. -Counseled on home exercise therapy and supportive care. -Prednisone. -Could consider physical therapy, injection or imaging.

## 2020-08-24 NOTE — Patient Instructions (Signed)
Nice to meet you Please try the exercises  Please try heat   Please send me a message in MyChart with any questions or updates.  Please see me back in 4 weeks.   --Dr. Raeford Razor

## 2020-08-24 NOTE — Progress Notes (Signed)
Nicholas Hernandez - 75 y.o. male MRN [id number]  Date of birth: [birth year]  SUBJECTIVE:  Including CC & ROS.  No chief complaint on file.   Nicholas KRAYNAK is a 75 y.o. male that is presenting with catching of his right hip.  He has had some improvement with meloxicam.  No injury or inciting event.  He is able to walk with no pain.  It occurs with certain transition from sitting to standing.   Review of Systems See HPI   HISTORY: Past Medical, Surgical, Social, and Family History Reviewed & Updated per EMR.   Pertinent Historical Findings include:  Past Medical History:  Diagnosis Date  . Allergic rhinitis   . CAD (coronary artery disease)    post 3.5 x 43mm xience durg-eluting stent to distal right coronary artery  . Chronic LBP   . Esophageal stricture    status post dilatation 10-08-08  . Essential hypertension, benign   . External hemorrhoids   . GERD (gastroesophageal reflux disease)   . History of esophageal stricture   . HLD (hyperlipidemia)   . Impaired fasting glucose   . Shingles     Past Surgical History:  Procedure Laterality Date  . APPENDECTOMY    . right inguinal herniorrhaphy    . TONSILLECTOMY      Family History  Problem Relation Age of Onset  . Heart attack Father        MI at 22, 30  . Hypertension Father   . Diabetes Mellitus II Father   . Heart attack Paternal Grandfather   . Heart attack Maternal Grandfather   . Healthy Sister   . Healthy Daughter   . Healthy Daughter     Social History   Socioeconomic History  . Marital status: Married    Spouse name: Not on file  . Number of children: Not on file  . Years of education: Not on file  . Highest education level: Not on file  Occupational History  . Not on file  Tobacco Use  . Smoking status: Former Research scientist (life sciences)  . Smokeless tobacco: Never Used  Vaping Use  . Vaping Use: Never used  Substance and Sexual Activity  . Alcohol use: Yes    Alcohol/week: 0.0 standard drinks    Comment: occas   . Drug use: No  . Sexual activity: Not on file  Other Topics Concern  . Not on file  Social History Narrative  . Not on file   Social Determinants of Health   Financial Resource Strain: Not on file  Food Insecurity: Not on file  Transportation Needs: Not on file  Physical Activity: Not on file  Stress: Not on file  Social Connections: Not on file  Intimate Partner Violence: Not on file     PHYSICAL EXAM:  VS: BP 110/74 (BP Location: Left Arm, Patient Position: Sitting, Cuff Size: Normal)   Ht 5\' 11"  (1.803 m)   Wt 190 lb (86.2 kg)   BMI 26.50 kg/m  Physical Exam Gen: NAD, alert, cooperative with exam, well-appearing MSK:  Right hip: Normal range of motion. Normal strength resistance. No swelling or ecchymosis. Neurovascular intact  Limited ultrasound: Right hip:  No changes of the femoral head and no significant effusion appreciated on exam. Degenerative changes of the pubis symphysis with mild effusion. Hyperechoic changes appreciated within the transition to the abductor longus. Degenerative changes appreciated of the labrum  Summary: Degenerative changes appreciated of the pubic symphysis and of the labrum.  Ultrasound and interpretation by  Clearance Coots, MD    ASSESSMENT & PLAN:   Hip impingement syndrome, right Seem to have a mild bursitis overlying the adductor longus but could be secondary to his impingement.  Only occurs with transitioning from sitting to standing.  No significant effusion of the hip but labral degenerative changes were appreciated on ultrasound. -Counseled on home exercise therapy and supportive care. -Prednisone. -Could consider physical therapy, injection or imaging.

## 2020-09-20 ENCOUNTER — Ambulatory Visit: Payer: Medicare Other | Admitting: Family Medicine

## 2021-02-10 ENCOUNTER — Other Ambulatory Visit (HOSPITAL_COMMUNITY): Payer: Self-pay | Admitting: Internal Medicine

## 2021-02-10 ENCOUNTER — Ambulatory Visit (HOSPITAL_COMMUNITY)
Admission: RE | Admit: 2021-02-10 | Discharge: 2021-02-10 | Disposition: A | Discharge status: Home / Self Care | Payer: Medicare Other | Source: Ambulatory Visit | Attending: Internal Medicine | Admitting: Internal Medicine

## 2021-02-10 ENCOUNTER — Other Ambulatory Visit: Payer: Self-pay

## 2021-02-10 DIAGNOSIS — R0989 Other specified symptoms and signs involving the circulatory and respiratory systems: Secondary | ICD-10-CM

## 2021-06-07 ENCOUNTER — Telehealth: Payer: Medicare Other | Admitting: Adult Health

## 2021-06-07 DIAGNOSIS — Z9989 Dependence on other enabling machines and devices: Secondary | ICD-10-CM | POA: Diagnosis not present

## 2021-06-07 DIAGNOSIS — G4733 Obstructive sleep apnea (adult) (pediatric): Secondary | ICD-10-CM | POA: Diagnosis not present

## 2021-06-07 NOTE — Progress Notes (Signed)
? ? ? ?PATIENT: Nicholas Hernandez ?DOB: Apr 16, 1945 ? ?REASON FOR VISIT: follow up ?HISTORY FROM: patient ?PRIMARY NEUROLOGIST:  ? ?Virtual Visit via Video Note ? ?I connected with Nicholas Hernandez on 06/07/21 at  3:15 PM EDT by a video enabled telemedicine application located remotely at [name] and verified that I am speaking with the correct person using two identifiers who was located at their own home. ?  ?I discussed the limitations of evaluation and management by telemedicine and the availability of in person appointments. The patient expressed understanding and agreed to proceed. ? ? ?PATIENT: Nicholas Hernandez ?DOB: 1945-06-08 ? ?REASON FOR VISIT: follow up ?HISTORY FROM: patient ? ?HISTORY OF PRESENT ILLNESS: ?Today 06/07/21: ? ?Nicholas Hernandez is a 76 year old male with a history of obstructive sleep apnea on CPAP.  He returns today for follow-up.  He reports that the CPAP is working well.  He denies any new issues.  He returns today for an evaluation. ? ? ? ?REVIEW OF SYSTEMS: Out of a complete 14 system review of symptoms, the patient complains only of the following symptoms, and all other reviewed systems are negative. ? ?ALLERGIES: ?Allergies  ?Allergen Reactions  ? Bee Venom Swelling  ? Codeine Nausea And Vomiting  ? ? ?HOME MEDICATIONS: ?Outpatient Medications Prior to Visit  ?Medication Sig Dispense Refill  ? aspirin 81 MG tablet Take 81 mg by mouth daily.    ? cetirizine (ZYRTEC) 10 MG tablet Take 10 mg by mouth daily.    ? ezetimibe (ZETIA) 10 MG tablet Take 10 mg by mouth daily.    ? fluticasone (FLONASE) 50 MCG/ACT nasal spray HOLD RIGHT NOW    ? Ginkgo Biloba 40 MG TABS Take 40 mg by mouth daily.    ? LIPITOR 40 MG tablet TAKE ONE TABLET BY MOUTH DAILY. 30 tablet 2  ? losartan (COZAAR) 100 MG tablet Take 100 mg by mouth daily.    ? meloxicam (MOBIC) 15 MG tablet Take 15 mg by mouth as needed.    ? nitroGLYCERIN (NITROSTAT) 0.4 MG SL tablet Place 0.4 mg under the tongue every 5 (five)  minutes as needed for chest pain (chest pain).    ? pantoprazole (PROTONIX) 40 MG tablet Take 40 mg by mouth as needed.    ? predniSONE (DELTASONE) 5 MG tablet Take 6 pills for first day, 5 pills second day, 4 pills third day, 3 pills fourth day, 2 pills the fifth day, and 1 pill sixth day. 21 tablet 0  ? Psyllium (METAMUCIL PO) Take 1 Dose by mouth 2 (two) times daily.     ? ZINC SULFATE PO Take 100 mg by mouth daily.    ? zolpidem (AMBIEN) 5 MG tablet Take 5 mg by mouth at bedtime as needed for sleep.    ? ?No facility-administered medications prior to visit.  ? ? ?PAST MEDICAL HISTORY: ?Past Medical History:  ?Diagnosis Date  ? Allergic rhinitis   ? CAD (coronary artery disease)   ? post 3.5 x 60mm xience durg-eluting stent to distal right coronary artery  ? Chronic LBP   ? Esophageal stricture   ? status post dilatation 10-08-08  ? Essential hypertension, benign   ? External hemorrhoids   ? GERD (gastroesophageal reflux disease)   ? History of esophageal stricture   ? HLD (hyperlipidemia)   ? Impaired fasting glucose   ? Shingles   ? ? ?PAST SURGICAL HISTORY: ?Past Surgical History:  ?Procedure Laterality Date  ? APPENDECTOMY    ?  right inguinal herniorrhaphy    ? TONSILLECTOMY    ? ? ?FAMILY HISTORY: ?Family History  ?Problem Relation Age of Onset  ? Heart attack Father   ?     MI at 36, 73  ? Hypertension Father   ? Diabetes Mellitus II Father   ? Heart attack Paternal Grandfather   ? Heart attack Maternal Grandfather   ? Healthy Sister   ? Healthy Daughter   ? Healthy Daughter   ? ? ?SOCIAL HISTORY: ?Social History  ? ?Socioeconomic History  ? Marital status: Married  ?  Spouse name: Not on file  ? Number of children: Not on file  ? Years of education: Not on file  ? Highest education level: Not on file  ?Occupational History  ? Not on file  ?Tobacco Use  ? Smoking status: Former  ? Smokeless tobacco: Never  ?Vaping Use  ? Vaping Use: Never used  ?Substance and Sexual Activity  ? Alcohol use: Yes  ?   Alcohol/week: 0.0 standard drinks  ?  Comment: occas  ? Drug use: No  ? Sexual activity: Not on file  ?Other Topics Concern  ? Not on file  ?Social History Narrative  ? Not on file  ? ?Social Determinants of Health  ? ?Financial Resource Strain: Not on file  ?Food Insecurity: Not on file  ?Transportation Needs: Not on file  ?Physical Activity: Not on file  ?Stress: Not on file  ?Social Connections: Not on file  ?Intimate Partner Violence: Not on file  ? ? ? ? ?PHYSICAL EXAM ?Generalized: Well developed, in no acute distress  ? ?Neurological examination  ?Mentation: Alert oriented to time, place, history taking. Follows all commands speech and language fluent ?Cranial nerve II-XII:Extraocular movements were full. Facial symmetry noted.  Head turning and shoulder shrug  were normal and symmetric. ? ? ?DIAGNOSTIC DATA (LABS, IMAGING, TESTING) ?- I reviewed patient records, labs, notes, testing and imaging myself where available. ? ?Lab Results  ?Component Value Date  ? WBC 9.4 04/12/2017  ? HGB 18.0 (H) 04/12/2017  ? HCT 53.1 (H) 04/12/2017  ? MCV 93.5 04/12/2017  ? PLT 231 04/12/2017  ? ?   ?Component Value Date/Time  ? NA 141 02/27/2017 1222  ? K 4.0 02/27/2017 1222  ? CL 104 10/10/2008 0635  ? CO2 26 02/27/2017 1222  ? GLUCOSE 109 02/27/2017 1222  ? BUN 14.5 02/27/2017 1222  ? CREATININE 1.1 02/27/2017 1222  ? CALCIUM 10.0 02/27/2017 1222  ? PROT 8.0 02/27/2017 1222  ? ALBUMIN 4.4 02/27/2017 1222  ? AST 27 02/27/2017 1222  ? ALT 23 02/27/2017 1222  ? ALKPHOS 137 02/27/2017 1222  ? BILITOT 1.83 (H) 02/27/2017 1222  ? GFRNONAA >60 10/10/2008 6962  ? GFRAA  10/10/2008 9528  ?  >60        ?The eGFR has been calculated ?using the MDRD equation. ?This calculation has not been ?validated in all clinical ?situations. ?eGFR's persistently ?<60 mL/min signify ?possible Chronic Kidney Disease.  ? ?Lab Results  ?Component Value Date  ? CHOL 176 01/19/2010  ? HDL 67.20 01/19/2010  ? [place] 96 01/19/2010  ? TRIG 63.0 01/19/2010   ? CHOLHDL 3 01/19/2010  ? ? ? ? ? ?ASSESSMENT AND PLAN ?76 y.o. year old male  has a past medical history of Allergic rhinitis, CAD (coronary artery disease), Chronic LBP, Esophageal stricture, Essential hypertension, benign, External hemorrhoids, GERD (gastroesophageal reflux disease), History of esophageal stricture, HLD (hyperlipidemia), Impaired fasting glucose, and Shingles. here with: ? ?  OSA on CPAP ? ?CPAP compliance excellent ?Residual AHI is good ?Encouraged patient to continue using CPAP nightly and > 4 hours each night ?F/U in 1 year or sooner if needed ? ? ? ?Ward Givens, MSN, NP-C 06/07/2021, 3:20 PM ?[facility] ?[address], [address] ?[place], [place] [address] ?[phone] ? ?

## 2022-03-02 LAB — LAB REPORT - SCANNED
A1c: 5.6
EGFR: 59

## 2022-03-28 DIAGNOSIS — M25511 Pain in right shoulder: Secondary | ICD-10-CM | POA: Diagnosis not present

## 2022-06-12 ENCOUNTER — Encounter: Payer: Self-pay | Admitting: *Deleted

## 2022-06-12 NOTE — Progress Notes (Unsigned)
PATIENT: Nicholas Hernandez DOB: March 11, 1946  REASON FOR VISIT: follow up HISTORY FROM: patient   Virtual Visit via Video Note  I connected with Gita Kudo on 06/13/22 at  2:30 PM EDT by a video enabled telemedicine application located remotely at [facility] and verified that I am speaking with the correct person using two identifiers who was located at their own home in [place].   I discussed the limitations of evaluation and management by telemedicine and the availability of in person appointments. The patient expressed understanding and agreed to proceed.   PATIENT: Nicholas Hernandez DOB: 1945-08-29  REASON FOR VISIT: follow up HISTORY FROM: patient  HISTORY OF PRESENT ILLNESS: Today 06/13/22:  Nicholas Hernandez is a 77 y.o. male with a history of obstructive sleep apnea on CPAP. Returns today for follow-up.  Reports that the CPAP continues to work well.  He changes at his  mask about every 3 months.  His download is below     06/07/21: [name] is a 77 year old male with a history of obstructive sleep apnea on CPAP.  He returns today for follow-up.  He reports that the CPAP is working well.  He denies any new issues.  He returns today for an evaluation.    REVIEW OF SYSTEMS: Out of a complete 14 system review of symptoms, the patient complains only of the following symptoms, and all other reviewed systems are negative.  ALLERGIES: Allergies  Allergen Reactions   Bee Venom Swelling   Codeine Nausea And Vomiting    HOME MEDICATIONS: Outpatient Medications Prior to Visit  Medication Sig Dispense Refill   aspirin 81 MG tablet Take 81 mg by mouth daily.     cetirizine (ZYRTEC) 10 MG tablet Take 10 mg by mouth daily.     ezetimibe (ZETIA) 10 MG tablet Take 10 mg by mouth daily.     fluticasone (FLONASE) 50 MCG/ACT nasal spray HOLD RIGHT NOW     Ginkgo Biloba 40 MG TABS Take 40 mg by mouth daily.     LIPITOR 40 MG tablet TAKE ONE TABLET BY MOUTH DAILY. 30  tablet 2   losartan (COZAAR) 100 MG tablet Take 100 mg by mouth daily.     meloxicam (MOBIC) 15 MG tablet Take 15 mg by mouth as needed.     nitroGLYCERIN (NITROSTAT) 0.4 MG SL tablet Place 0.4 mg under the tongue every 5 (five) minutes as needed for chest pain (chest pain).     pantoprazole (PROTONIX) 40 MG tablet Take 40 mg by mouth as needed.     predniSONE (DELTASONE) 5 MG tablet Take 6 pills for first day, 5 pills second day, 4 pills third day, 3 pills fourth day, 2 pills the fifth day, and 1 pill sixth day. 21 tablet 0   Psyllium (METAMUCIL PO) Take 1 Dose by mouth 2 (two) times daily.      ZINC SULFATE PO Take 100 mg by mouth daily.     zolpidem (AMBIEN) 5 MG tablet Take 5 mg by mouth at bedtime as needed for sleep.     No facility-administered medications prior to visit.    PAST MEDICAL HISTORY: Past Medical History:  Diagnosis Date   Allergic rhinitis    CAD (coronary artery disease)    post 3.5 x 15mm xience durg-eluting stent to distal right coronary artery   Chronic LBP    Esophageal stricture    status post dilatation 10-08-08   Essential hypertension, benign  External hemorrhoids    GERD (gastroesophageal reflux disease)    History of esophageal stricture    HLD (hyperlipidemia)    Impaired fasting glucose    Shingles     PAST SURGICAL HISTORY: Past Surgical History:  Procedure Laterality Date   APPENDECTOMY     right inguinal herniorrhaphy     TONSILLECTOMY      FAMILY HISTORY: Family History  Problem Relation Age of Onset   Heart attack Father        MI at 100, 51   Hypertension Father    Diabetes Mellitus II Father    Heart attack Paternal Grandfather    Heart attack Maternal Grandfather    Healthy Sister    Healthy Daughter    Healthy Daughter     SOCIAL HISTORY: Social History   Socioeconomic History   Marital status: Married    Spouse name: Not on file   Number of children: Not on file   Years of education: Not on file   Highest  education level: Not on file  Occupational History   Not on file  Tobacco Use   Smoking status: Former   Smokeless tobacco: Never  Vaping Use   Vaping Use: Never used  Substance and Sexual Activity   Alcohol use: Yes    Alcohol/week: 0.0 standard drinks of alcohol    Comment: occas   Drug use: No   Sexual activity: Not on file  Other Topics Concern   Not on file  Social History Narrative   Not on file   Social Determinants of Health   Financial Resource Strain: Not on file  Food Insecurity: Not on file  Transportation Needs: Not on file  Physical Activity: Not on file  Stress: Not on file  Social Connections: Not on file  Intimate Partner Violence: Not on file      PHYSICAL EXAM Generalized: Well developed, in no acute distress   Neurological examination  Mentation: Alert oriented to time, place, history taking. Follows all commands speech and language fluent Cranial nerve II-XII: Facial symmetry noted  DIAGNOSTIC DATA (LABS, IMAGING, TESTING) - I reviewed patient records, labs, notes, testing and imaging myself where available.  Lab Results  Component Value Date   WBC 9.4 04/12/2017   HGB 18.0 (H) 04/12/2017   HCT 53.1 (H) 04/12/2017   MCV 93.5 04/12/2017   PLT 231 04/12/2017      Component Value Date/Time   NA 141 02/27/2017 1222   K 4.0 02/27/2017 1222   CL 104 10/10/2008 0635   CO2 26 02/27/2017 1222   GLUCOSE 109 02/27/2017 1222   BUN 14.5 02/27/2017 1222   CREATININE 1.1 02/27/2017 1222   CALCIUM 10.0 02/27/2017 1222   PROT 8.0 02/27/2017 1222   ALBUMIN 4.4 02/27/2017 1222   AST 27 02/27/2017 1222   ALT 23 02/27/2017 1222   ALKPHOS 137 02/27/2017 1222   BILITOT 1.83 (H) 02/27/2017 1222   GFRNONAA >60 10/10/2008 0635   GFRAA  10/10/2008 0635    >60        The eGFR has been calculated using the MDRD equation. This calculation has not been validated in all clinical situations. eGFR's persistently <60 mL/min signify possible Chronic Kidney  Disease.   Lab Results  Component Value Date   CHOL 176 01/19/2010   HDL 67.20 01/19/2010   LDLCALC 96 01/19/2010   TRIG 63.0 01/19/2010   CHOLHDL 3 01/19/2010       ASSESSMENT AND PLAN 77 y.o. year old male  has a past medical history of Allergic rhinitis, CAD (coronary artery disease), Chronic LBP, Esophageal stricture, Essential hypertension, benign, External hemorrhoids, GERD (gastroesophageal reflux disease), History of esophageal stricture, HLD (hyperlipidemia), Impaired fasting glucose, and Shingles. here with:  OSA on CPAP  CPAP compliance excellent Residual AHI is good Encouraged patient to continue using CPAP nightly and > 4 hours each night F/U in 1 year or sooner if needed    Butch Penny, MSN, NP-C 06/13/2022, 2:25 PM [facility] [address] [place], [place] [address] [phone]

## 2022-06-13 ENCOUNTER — Telehealth (INDEPENDENT_AMBULATORY_CARE_PROVIDER_SITE_OTHER): Payer: Medicare HMO | Admitting: Adult Health

## 2022-06-13 DIAGNOSIS — G4733 Obstructive sleep apnea (adult) (pediatric): Secondary | ICD-10-CM

## 2022-06-14 DIAGNOSIS — G4733 Obstructive sleep apnea (adult) (pediatric): Secondary | ICD-10-CM | POA: Diagnosis not present

## 2022-07-03 ENCOUNTER — Encounter: Payer: Self-pay | Admitting: Cardiovascular Disease

## 2022-07-03 ENCOUNTER — Ambulatory Visit: Payer: Medicare HMO | Attending: Cardiovascular Disease | Admitting: Cardiovascular Disease

## 2022-07-03 VITALS — BP 130/74 | HR 68 | Ht 71.0 in | Wt 187.8 lb

## 2022-07-03 DIAGNOSIS — I10 Essential (primary) hypertension: Secondary | ICD-10-CM

## 2022-07-03 DIAGNOSIS — I2510 Atherosclerotic heart disease of native coronary artery without angina pectoris: Secondary | ICD-10-CM

## 2022-07-03 DIAGNOSIS — E782 Mixed hyperlipidemia: Secondary | ICD-10-CM | POA: Diagnosis not present

## 2022-07-03 NOTE — Progress Notes (Signed)
Cardiology Office Note:    Date:  07/03/2022   ID:  Nicholas, Hernandez 1946/02/25, MRN [id number]  PCP:  Nicholas Hernandez., MD   [facility] Providers Cardiologist:  Nicholas Bollman, MD     Referring MD: Nicholas Hernandez., MD   Chief Complaint  Patient presents with   Coronary Artery Disease    History of Present Illness:    Nicholas Hernandez is a 77 y.o. male presenting for follow-up of coronary artery disease.  The patient was initially diagnosed with CAD in 2010 when he presented with unstable angina.  He underwent PCI of the right coronary artery with a drug-eluting stent at that time.  He has not had recurrent angina or ischemic symptoms.  His last office visit here was in April 2022.  2-year follow-up was recommended as he follows closely with his primary care physician, Dr. Clelia Hernandez.  The patient is here alone today. He remains active with pickle ball and golf. He walks the golf course and is averaging 15K steps per day. Today, he denies symptoms of palpitations, chest pain, shortness of breath, orthopnea, PND, dizziness, or syncope. He reports very mild swelling in the right ankle at times. He has had some intermittent discomfort in his right calf but this has resolved with meloxicam. doesn't have any concerns today.    Past Medical History:  Diagnosis Date   Allergic rhinitis    CAD (coronary artery disease)    post 3.5 x 65mm xience durg-eluting stent to distal right coronary artery   Chronic LBP    Esophageal stricture    status post dilatation 10-08-08   Essential hypertension, benign    External hemorrhoids    GERD (gastroesophageal reflux disease)    History of esophageal stricture    HLD (hyperlipidemia)    Impaired fasting glucose    Shingles     Past Surgical History:  Procedure Laterality Date   APPENDECTOMY     right inguinal herniorrhaphy     TONSILLECTOMY      Current Medications: Current Meds  Medication Sig   aspirin 81 MG tablet Take 81  mg by mouth daily.   cetirizine (ZYRTEC) 10 MG tablet Take 10 mg by mouth daily.   ezetimibe (ZETIA) 10 MG tablet Take 10 mg by mouth daily.   fluticasone (FLONASE) 50 MCG/ACT nasal spray HOLD RIGHT NOW   LIPITOR 40 MG tablet TAKE ONE TABLET BY MOUTH DAILY.   losartan (COZAAR) 100 MG tablet Take 100 mg by mouth daily.   meloxicam (MOBIC) 15 MG tablet Take 15 mg by mouth as needed.   nitroGLYCERIN (NITROSTAT) 0.4 MG SL tablet Place 0.4 mg under the tongue every 5 (five) minutes as needed for chest pain (chest pain).   pantoprazole (PROTONIX) 40 MG tablet Take 40 mg by mouth as needed.   Psyllium (METAMUCIL PO) Take 1 Dose by mouth daily.   ZINC SULFATE PO Take 100 mg by mouth daily.   zolpidem (AMBIEN) 5 MG tablet Take 5 mg by mouth at bedtime as needed for sleep.   [DISCONTINUED] Ginkgo Biloba 40 MG TABS Take 40 mg by mouth daily.     Allergies:   Bee venom and Codeine   Social History   Socioeconomic History   Marital status: Married    Spouse name: Not on file   Number of children: Not on file   Years of education: Not on file   Highest education level: Not on file  Occupational History  Not on file  Tobacco Use   Smoking status: Former   Smokeless tobacco: Never  Vaping Use   Vaping Use: Never used  Substance and Sexual Activity   Alcohol use: Yes    Alcohol/week: 0.0 standard drinks of alcohol    Comment: occas   Drug use: No   Sexual activity: Not on file  Other Topics Concern   Not on file  Social History Narrative   Not on file   Social Determinants of Health   Financial Resource Strain: Not on file  Food Insecurity: Not on file  Transportation Needs: Not on file  Physical Activity: Not on file  Stress: Not on file  Social Connections: Not on file     Family History: The patient's family history includes Diabetes Mellitus II in his father; Healthy in his daughter, daughter, and sister; Heart attack in his father, maternal grandfather, and paternal  grandfather; Hypertension in his father.  ROS:   Please see the history of present illness.    All other systems reviewed and are negative.  EKGs/Labs/Other Studies Reviewed:    The following studies were reviewed today: Cardiac Studies & Procedures     STRESS TESTS  EXERCISE TOLERANCE TEST (ETT) 02/22/2015  Narrative  Upsloping ST segment depression ST segment depression was noted during stress, and returning to baseline after less than 1 minute of recovery.   ECHOCARDIOGRAM  ECHOCARDIOGRAM COMPLETE 05/07/2017  Narrative *[facility]* [address] [place], [place] [address] [phone]  ------------------------------------------------------------------- Transthoracic Echocardiography  Patient:    Nicholas Hernandez MR #:       [id number] Study Date: 05/07/2017 Gender:     M Age:        71 Height:     182.9 cm Weight:     87.2 kg BSA:        2.11 m^2 Pt. Status: Room:  [facility] ATTENDING    Nicholas Hernandez, M.D. SONOGRAPHER  Nicholas Hernandez, RCS PERFORMING   [facility], Outpatient  cc:  ------------------------------------------------------------------- LV EF: 65% -   70%  ------------------------------------------------------------------- Indications:      DOE (R06.09).  ------------------------------------------------------------------- History:   PMH:  Hyperlipidemia.  Coronary artery disease.  ------------------------------------------------------------------- Study Conclusions  - Left ventricle: The cavity size was normal. There was mild concentric hypertrophy. Systolic function was vigorous. The estimated ejection fraction was in the range of 65% to 70%. Wall motion was normal; there were no regional wall motion abnormalities. Doppler parameters are consistent with abnormal left ventricular relaxation (grade 1 diastolic dysfunction). There was no evidence of elevated ventricular filling pressure  by Doppler parameters. - Aortic valve: There was no regurgitation. - Aortic root: The aortic root was normal in size. - Mitral valve: Calcified annulus. Mildly thickened leaflets with redundant chardae. There was trivial regurgitation. - Left atrium: The atrium was normal in size. - Right ventricle: The cavity size was normal. Wall thickness was normal. Systolic function was normal. - Right atrium: The atrium was normal in size. - Tricuspid valve: There was trivial regurgitation. - Pulmonary arteries: Systolic pressure was within the normal range. - Inferior vena cava: The vessel was normal in size. The respirophasic diameter changes were in the normal range (= 50%), consistent with normal central venous pressure. - Pericardium, extracardiac: There was no pericardial effusion.  ------------------------------------------------------------------- Study data:  No prior study was available for comparison.  Study status:  Routine.  Procedure:  The patient reported no  pain pre or post test. Transthoracic echocardiography. Image quality was adequate.          Transthoracic echocardiography. M-mode, complete 2D,3D, spectral Doppler, and color Doppler.  Birthdate:  Patient birthdate: 07/16/1945.  Age:  Patient is 77 yr old.  Sex:  Gender: male.    BMI: 26.1 kg/m^2.  Blood pressure:     143/82  Patient status:  Outpatient.  Study date:  Study date: 05/07/2017. Study time: 07:36 AM.  Location:  [facility]  -------------------------------------------------------------------  ------------------------------------------------------------------- Left ventricle:  The cavity size was normal. There was mild concentric hypertrophy. Systolic function was vigorous. The estimated ejection fraction was in the range of 65% to 70%. Wall motion was normal; there were no regional wall motion abnormalities. Doppler parameters are consistent with abnormal left ventricular relaxation (grade 1 diastolic  dysfunction). There was no evidence of elevated ventricular filling pressure by Doppler parameters.  ------------------------------------------------------------------- Aortic valve:   Trileaflet; mildly thickened, mildly calcified leaflets. Mobility was not restricted.  Doppler:  Transvalvular velocity was within the normal range. There was no stenosis. There was no regurgitation.  ------------------------------------------------------------------- Aorta:  Aortic root: The aortic root was normal in size.  ------------------------------------------------------------------- Mitral valve:  Calcified annulus. Mildly thickened leaflets with redundant chardae. Mobility was not restricted.  Doppler: Transvalvular velocity was within the normal range. There was no evidence for stenosis. There was trivial regurgitation.  ------------------------------------------------------------------- Left atrium:  The atrium was normal in size.  ------------------------------------------------------------------- Right ventricle:  The cavity size was normal. Wall thickness was normal. Systolic function was normal.  ------------------------------------------------------------------- Pulmonic valve:    Doppler:  Transvalvular velocity was within the normal range. There was no evidence for stenosis.  ------------------------------------------------------------------- Tricuspid valve:   Structurally normal valve.    Doppler: Transvalvular velocity was within the normal range. There was trivial regurgitation.  ------------------------------------------------------------------- Pulmonary artery:   The main pulmonary artery was normal-sized. Systolic pressure was within the normal range.  ------------------------------------------------------------------- Right atrium:  The atrium was normal in size.  ------------------------------------------------------------------- Pericardium:  There was no  pericardial effusion.  ------------------------------------------------------------------- Systemic veins: Inferior vena cava: The vessel was normal in size. The respirophasic diameter changes were in the normal range (= 50%), consistent with normal central venous pressure. Diameter: 14 mm.  ------------------------------------------------------------------- Measurements  IVC                                        Value        Reference ID                                         14    mm     ----------  Left ventricle                             Value        Reference LV ID, ED, PLAX chordal                    43.9  mm     43 - 52 LV ID, ES, PLAX chordal                    27.7  mm     23 - 38 LV  fx shortening, PLAX chordal             37    %      >=29 LV PW thickness, ED                        12    mm     ---------- IVS/LV PW ratio, ED                        0.92         <=1.3 Stroke volume, 2D                          82    ml     ---------- Stroke volume/bsa, 2D                      39    ml/m^2 ---------- LV e&', lateral                             6.31  cm/s   ---------- LV E/e&', lateral                           10.76        ---------- LV e&', medial                              6.53  cm/s   ---------- LV E/e&', medial                            10.4         ---------- LV e&', average                             6.42  cm/s   ---------- LV E/e&', average                           10.58        ----------  Ventricular septum                         Value        Reference IVS thickness, ED                          11    mm     ----------  LVOT                                       Value        Reference LVOT ID, S                                 20    mm     ---------- LVOT area  3.14  cm^2   ---------- LVOT peak velocity, S                      112   cm/s   ---------- LVOT mean velocity, S                      72.4  cm/s   ---------- LVOT  VTI, S                                26.2  cm     ---------- LVOT peak gradient, S                      5     mm Hg  ----------  Aorta                                      Value        Reference Aortic root ID, ED                         35    mm     ----------  Left atrium                                Value        Reference LA ID, A-P, ES                             36    mm     ---------- LA ID/bsa, A-P                             1.7   cm/m^2 <=2.2 LA volume, S                               45.9  ml     ---------- LA volume/bsa, S                           21.7  ml/m^2 ---------- LA volume, ES, 1-p A4C                     39.7  ml     ---------- LA volume/bsa, ES, 1-p A4C                 18.8  ml/m^2 ---------- LA volume, ES, 1-p A2C                     51.9  ml     ---------- LA volume/bsa, ES, 1-p A2C                 24.5  ml/m^2 ----------  Mitral valve                               Value        Reference Mitral E-wave peak velocity  67.9  cm/s   ---------- Mitral A-wave peak velocity                82.5  cm/s   ---------- Mitral deceleration time           (H)     366   ms     150 - 230 Mitral E/A ratio, peak                     0.8          ----------  Pulmonary arteries                         Value        Reference PA pressure, S, DP                         14    mm Hg  <=30  Tricuspid valve                            Value        Reference Tricuspid regurg peak velocity             167   cm/s   ---------- Tricuspid peak RV-RA gradient              11    mm Hg  ---------- Tricuspid maximal regurg velocity,         167   cm/s   ---------- PISA  Right atrium                               Value        Reference RA ID, S-I, ES, A4C                (H)     51.3  mm     34 - 49 RA area, ES, A4C                           14.7  cm^2   8.3 - 19.5 RA volume, ES, A/L                         34.7  ml     ---------- RA volume/bsa, ES, A/L                     16.4   ml/m^2 ----------  Systemic veins                             Value        Reference Estimated CVP                              3     mm Hg  ----------  Right ventricle                            Value        Reference TAPSE  19.3  mm     ---------- RV pressure, S, DP                         14    mm Hg  <=30 RV s&', lateral, S                          11    cm/s   ----------  Legend: (L)  and  (H)  mark values outside specified reference range.  ------------------------------------------------------------------- Prepared and Electronically Authenticated by  Tobias Alexander, M.D. 2019-02-11T11:38:38              EKG:  EKG is ordered today.  The ekg ordered today demonstrates NSR 68 bpm, within normal limits  Recent Labs: No results found for requested labs within last 365 days.  Recent Lipid Panel    Component Value Date/Time   CHOL 176 01/19/2010 0924   TRIG 63.0 01/19/2010 0924   HDL 67.20 01/19/2010 0924   CHOLHDL 3 01/19/2010 0924   VLDL 12.6 01/19/2010 0924   LDLCALC 96 01/19/2010 0924     Risk Assessment/Calculations:                Physical Exam:    VS:  BP 130/74   Pulse 68   Ht 5\' 11"  (1.803 m)   Wt 187 lb 12.8 oz (85.2 kg)   SpO2 99%   BMI 26.19 kg/m     Wt Readings from Last 3 Encounters:  07/03/22 187 lb 12.8 oz (85.2 kg)  08/24/20 190 lb (86.2 kg)  06/30/20 190 lb 6.4 oz (86.4 kg)     GEN:  Well nourished, well developed in no acute distress HEENT: Normal NECK: No JVD; No carotid bruits LYMPHATICS: No lymphadenopathy CARDIAC: RRR, no murmurs, rubs, gallops RESPIRATORY:  Clear to auscultation without rales, wheezing or rhonchi  ABDOMEN: Soft, non-tender, non-distended MUSCULOSKELETAL:  No edema; No deformity  SKIN: Warm and dry NEUROLOGIC:  Alert and oriented x 3 PSYCHIATRIC:  Normal affect   ASSESSMENT:    1. Coronary artery disease involving native coronary artery of native heart without  angina pectoris   2. Mixed hyperlipidemia   3. Essential hypertension    PLAN:    In order of problems listed above:  The patient continues to do very well with no symptoms of angina.  His EKG is normal.  His cardiovascular risk factors are very well-controlled.  He follows regularly with Dr. Clelia Hernandez.  I will see him back in 2 years unless problems arise. Lipids are excellent.  Cholesterol is 147, HDL 90, LDL 50.  Continue combination of atorvastatin and ezetimibe. Blood pressure initially elevated, on my recheck it is 130/74.  He is treated with losartan.  Continue current management.  All of his labs are reviewed.  They are scanned into the chart.  I will see him back in 2 years unless he has any cardiac issues in the interim.     Medication Adjustments/Labs and Tests Ordered: Current medicines are reviewed at length with the patient today.  Concerns regarding medicines are outlined above.  Orders Placed This Encounter  Procedures   EKG 12-Lead   No orders of the defined types were placed in this encounter.   Patient Instructions       Signed, Nicholas Bollman, MD  07/03/2022 12:52 PM    [facility]

## 2022-07-10 ENCOUNTER — Encounter: Payer: Self-pay | Admitting: *Deleted

## 2022-07-15 DIAGNOSIS — G4733 Obstructive sleep apnea (adult) (pediatric): Secondary | ICD-10-CM | POA: Diagnosis not present

## 2022-07-19 DIAGNOSIS — L814 Other melanin hyperpigmentation: Secondary | ICD-10-CM | POA: Diagnosis not present

## 2022-07-19 DIAGNOSIS — D229 Melanocytic nevi, unspecified: Secondary | ICD-10-CM | POA: Diagnosis not present

## 2022-07-19 DIAGNOSIS — Z85828 Personal history of other malignant neoplasm of skin: Secondary | ICD-10-CM | POA: Diagnosis not present

## 2022-07-19 DIAGNOSIS — L821 Other seborrheic keratosis: Secondary | ICD-10-CM | POA: Diagnosis not present

## 2022-07-19 DIAGNOSIS — L570 Actinic keratosis: Secondary | ICD-10-CM | POA: Diagnosis not present

## 2022-08-14 DIAGNOSIS — G4733 Obstructive sleep apnea (adult) (pediatric): Secondary | ICD-10-CM | POA: Diagnosis not present

## 2022-08-27 DIAGNOSIS — R69 Illness, unspecified: Secondary | ICD-10-CM | POA: Diagnosis not present

## 2022-09-12 DIAGNOSIS — R972 Elevated prostate specific antigen [PSA]: Secondary | ICD-10-CM | POA: Diagnosis not present

## 2022-10-02 DIAGNOSIS — R69 Illness, unspecified: Secondary | ICD-10-CM | POA: Diagnosis not present

## 2022-10-04 DIAGNOSIS — R972 Elevated prostate specific antigen [PSA]: Secondary | ICD-10-CM | POA: Diagnosis not present

## 2022-10-06 ENCOUNTER — Other Ambulatory Visit: Payer: Self-pay | Admitting: Urology

## 2022-10-06 DIAGNOSIS — R972 Elevated prostate specific antigen [PSA]: Secondary | ICD-10-CM

## 2022-10-19 DIAGNOSIS — H5213 Myopia, bilateral: Secondary | ICD-10-CM | POA: Diagnosis not present

## 2022-11-13 DIAGNOSIS — R69 Illness, unspecified: Secondary | ICD-10-CM | POA: Diagnosis not present

## 2022-11-21 ENCOUNTER — Ambulatory Visit
Admission: RE | Admit: 2022-11-21 | Discharge: 2022-11-21 | Disposition: A | Discharge status: Home / Self Care | Payer: Medicare HMO | Source: Ambulatory Visit | Attending: Urology | Admitting: Urology

## 2022-11-21 DIAGNOSIS — R972 Elevated prostate specific antigen [PSA]: Secondary | ICD-10-CM

## 2022-11-21 MED ORDER — GADOPICLENOL 0.5 MMOL/ML IV SOLN
9.0000 mL | Freq: Once | INTRAVENOUS | Status: AC | PRN
  Administered 2022-11-21: 9 mL via INTRAVENOUS

## 2022-11-24 DIAGNOSIS — R69 Illness, unspecified: Secondary | ICD-10-CM | POA: Diagnosis not present

## 2022-11-24 DIAGNOSIS — M25511 Pain in right shoulder: Secondary | ICD-10-CM | POA: Diagnosis not present

## 2022-12-09 DIAGNOSIS — M25511 Pain in right shoulder: Secondary | ICD-10-CM | POA: Diagnosis not present

## 2022-12-15 DIAGNOSIS — R69 Illness, unspecified: Secondary | ICD-10-CM | POA: Diagnosis not present

## 2022-12-18 DIAGNOSIS — M25511 Pain in right shoulder: Secondary | ICD-10-CM | POA: Diagnosis not present

## 2022-12-25 DIAGNOSIS — M25511 Pain in right shoulder: Secondary | ICD-10-CM | POA: Diagnosis not present

## 2022-12-27 ENCOUNTER — Encounter: Payer: Self-pay | Admitting: Sports Medicine

## 2022-12-27 ENCOUNTER — Ambulatory Visit: Payer: Medicare HMO | Admitting: Sports Medicine

## 2022-12-27 VITALS — BP 136/80 | Ht 71.0 in | Wt 185.0 lb

## 2022-12-27 DIAGNOSIS — M25512 Pain in left shoulder: Secondary | ICD-10-CM | POA: Diagnosis not present

## 2022-12-27 NOTE — Progress Notes (Signed)
   Subjective:    Patient ID: Nicholas Hernandez, male    DOB: 09/24/1945, 77 y.o.   MRN: [id number]  HPI chief complaint: Left shoulder pain  Patient is a very pleasant 9 right-hand-dominant male that presents today with left shoulder pain.  He initially injured the shoulder 3 months ago.  He slipped while grilling and threw his left arm up in the air.  He had immediate pain in the lateral shoulder but it gradually improved.  Then while playing pickle ball about 6 weeks ago, he threw his arm up in the air once again and had immediate pain again.  He began to work with Ellamae Sia and physical therapy.  Nicholas Hernandez had him doing some strengthening exercises for his rotator cuff.  This actually aggravated his symptoms to the point where he had difficulty even raising his arm up.  However, over the past couple of days, his symptoms have improved dramatically.  He has now regained full range of motion.  His pain is minimal.  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  He denies radiating pain down the arm.  He did get some left-sided neck pain a couple of nights ago but that has improved.  He does also endorse nighttime pain.  He takes 7.5 mg of meloxicam when needed and this does seem to help.  He denies problems with the shoulder in the past.  No prior shoulder surgeries.  No imaging today.  Past medical history reviewed Medications reviewed Allergies reviewed   Review of Systems As above    Objective:   Physical Exam  Well-developed, well-nourished.  No acute distress  Left shoulder: There does appear to be a little bit of supraspinatus atrophy in the supraspinatus fossa.  Otherwise, no gross deformity.  Full painless shoulder range of motion with a negative empty can.  Negative Hawkins.  No tenderness to palpation over the [facility] joint nor over the bicipital groove.  Rotator cuff strength is 5/5 bilaterally and does not reproduce pain.  Negative O'Brien's.  Neurovascularly intact distally.  Limited MSK  ultrasound of the left shoulder shows no obvious mushroom sign at the [facility] joint.  There is a small area of hypoechoic change in the posterior aspect of the supraspinatus tendon suggesting a probable small partial-thickness tear here.      Assessment & Plan:   Left shoulder pain likely secondary to supraspinatus tear  The ultrasound shows the bulk of his rotator cuff tendon to be intact.  He does however have a small tear in the posterior aspect of the supraspinatus tendon.  His clinical exam is fairly unremarkable today and I recommended that we take a simple watchful waiting approach.  I do think he should discontinue physical therapy as that is probably exacerbating his symptoms.  He may continue with all other activity as tolerated.  If pain returns then we discussed a return to the office for a subacromial cortisone injection.  He may also continue with his meloxicam.  He can increase the dose to 15 mg as needed.  He will follow-up with me for ongoing or recalcitrant issues.  This note was dictated using Dragon naturally speaking software and may contain errors in syntax, spelling, or content which have not been identified prior to signing this note.

## 2023-01-11 DIAGNOSIS — Z85828 Personal history of other malignant neoplasm of skin: Secondary | ICD-10-CM | POA: Diagnosis not present

## 2023-01-11 DIAGNOSIS — B351 Tinea unguium: Secondary | ICD-10-CM | POA: Diagnosis not present

## 2023-01-11 DIAGNOSIS — L219 Seborrheic dermatitis, unspecified: Secondary | ICD-10-CM | POA: Diagnosis not present

## 2023-01-11 DIAGNOSIS — L570 Actinic keratosis: Secondary | ICD-10-CM | POA: Diagnosis not present

## 2023-01-22 DIAGNOSIS — R972 Elevated prostate specific antigen [PSA]: Secondary | ICD-10-CM | POA: Diagnosis not present

## 2023-01-22 DIAGNOSIS — C61 Malignant neoplasm of prostate: Secondary | ICD-10-CM | POA: Diagnosis not present

## 2023-01-23 ENCOUNTER — Ambulatory Visit: Payer: Medicare HMO | Admitting: Sports Medicine

## 2023-01-23 ENCOUNTER — Encounter: Payer: Self-pay | Admitting: Sports Medicine

## 2023-01-23 VITALS — BP 120/64 | Ht 71.0 in | Wt 185.0 lb

## 2023-01-23 DIAGNOSIS — M25512 Pain in left shoulder: Secondary | ICD-10-CM

## 2023-01-23 MED ORDER — METHYLPREDNISOLONE ACETATE 40 MG/ML IJ SUSP
40.0000 mg | Freq: Once | INTRAMUSCULAR | Status: AC
Start: 2023-01-23 — End: 2023-01-23
  Administered 2023-01-23: 40 mg via INTRA_ARTICULAR

## 2023-01-23 NOTE — Progress Notes (Signed)
Patient ID: Nicholas Hernandez, male   DOB: 02/02/1946, 77 y.o.   MRN: [id number]  Patient presents today for a left shoulder subacromial cortisone injection.  Point-of-care ultrasound on December 27, 2022 showed a small tear of the posterior aspect of the supraspinatus tendon.  He recently moved houses and as a result his shoulder is aching more.  Subacromial cortisone injection performed as below.  He tolerated this without difficulty.  If symptoms persist despite today's injection then consider imaging in the form of x-ray and MRI at that time.  Follow-up for ongoing or recalcitrant issues.  Consent obtained and verified. Time-out conducted. Noted no overlying erythema, induration, or other signs of local infection. Skin prepped in a sterile fashion. Topical analgesic spray: Ethyl chloride. Joint: Left shoulder, subacromial Needle: 25-gauge 1.5 inch Completed without difficulty. Meds: 3 cc 1% Xylocaine, 1 cc (40 mg) Depo-Medrol

## 2023-01-26 DIAGNOSIS — R69 Illness, unspecified: Secondary | ICD-10-CM | POA: Diagnosis not present

## 2023-01-29 ENCOUNTER — Other Ambulatory Visit (HOSPITAL_COMMUNITY): Payer: Self-pay | Admitting: Urology

## 2023-01-29 DIAGNOSIS — C61 Malignant neoplasm of prostate: Secondary | ICD-10-CM

## 2023-01-29 DIAGNOSIS — R69 Illness, unspecified: Secondary | ICD-10-CM | POA: Diagnosis not present

## 2023-01-30 DIAGNOSIS — R69 Illness, unspecified: Secondary | ICD-10-CM | POA: Diagnosis not present

## 2023-01-31 DIAGNOSIS — R69 Illness, unspecified: Secondary | ICD-10-CM | POA: Diagnosis not present

## 2023-02-01 ENCOUNTER — Telehealth: Payer: Self-pay

## 2023-02-01 NOTE — Telephone Encounter (Signed)
LVM for patient to return my call in reference to upcoming [facility] appointment on 11/26 at 1230, left my call back number and also advised I would mail out packet

## 2023-02-13 ENCOUNTER — Encounter (HOSPITAL_COMMUNITY)
Admission: RE | Admit: 2023-02-13 | Discharge: 2023-02-13 | Disposition: A | Discharge status: Home / Self Care | Payer: Medicare HMO | Source: Ambulatory Visit | Attending: Urology | Admitting: Urology

## 2023-02-13 DIAGNOSIS — C61 Malignant neoplasm of prostate: Secondary | ICD-10-CM | POA: Insufficient documentation

## 2023-02-13 MED ORDER — FLOTUFOLASTAT F 18 GALLIUM 296-5846 MBQ/ML IV SOLN
8.4360 | Freq: Once | INTRAVENOUS | Status: AC
  Administered 2023-02-13: 8.436 via INTRAVENOUS
  Filled 2023-02-13: qty 9

## 2023-02-19 DIAGNOSIS — C61 Malignant neoplasm of prostate: Secondary | ICD-10-CM | POA: Insufficient documentation

## 2023-02-19 NOTE — Progress Notes (Unsigned)
[facility] CONSULT NOTE  Patient Care Team: Cleatis Polka., MD as PCP - General (Internal Medicine) Tonny Bollman, MD as PCP - Cardiology (Cardiology) Cherlyn Cushing, RN as Oncology Nurse Navigator  ASSESSMENT & PLAN:  Nicholas Hernandez is a 77 y.o.male with history of CAD with stent and erectile dysfunction being seen at [facility] for prostate cancer.  Current diagnosis: cT1cN0M0 GS 4+3=7 GG3 adenocarcinoma. PSA 9.48. unfavorable intermediate risk. Germline testing: NA Somatic testing: NA Treatment: to be determined  His case was discussed at tumor board with multiple specialists including radiation oncologist, urology oncologist, pathologist, radiologist. The patient was counseled on the natural history of prostate cancer and the standard treatment options that are available for prostate cancer.   For unfavorable intermediate risk, per NCCN guideline, in patients with > 10 years of life expectancy, RP + PLND or RT are both options resulted in excellent long term survival.  Patient will follow-up with radiation oncology or urologic oncology for definitive treatment.  He may follow-up with medical oncology as needed.  Melven Sartorius, MD 11/26/20243:00 PM  CHIEF COMPLAINTS/PURPOSE OF CONSULTATION:  Prostate cancer  HISTORY OF PRESENTING ILLNESS:  Nicholas Hernandez 77 y.o. male is here because of prostate cancer.  I have reviewed his chart and materials related to his cancer extensively and collaborated history with the patient. Summary of oncologic history is as follows:  He denies any urinary symptoms and he is feeling well.  Oncology History  Primary prostate adenocarcinoma (HCC)  11/21/2022 Imaging   MR PROSTATE 1. Two PI-RADS category 4 lesions are identified in the peripheral zone. In particular, region of interest # 1 has very suspicious features. Targeting data sent to [facility]. 2. Left trochanteric bursitis. 3. Sigmoid colon diverticulosis.   01/22/2023  Pathology Results   BIOPSY ROI1 RIGHT. GS 4+3=7 adenocarcinoma. 2 cores. 60%, 5% pattern 4 80% ROI2 RIGHT. GS 4+3=7 adenocarcinoma. 2 cores. 50%, 50%  pattern 4 80% Right apex adenocarcinoma in 50%. GS 4+3=7 PNI identified. 0.7 cm   01/22/2023 Cancer Staging   Staging form: Prostate, AJCC 8th Edition - Clinical stage from 01/22/2023: Stage IIC (cT1c, cN0, cM0, PSA: 9, Grade Group: 3) - Signed by Marcello Fennel, PA-C on 02/20/2023 Histopathologic type: Adenocarcinoma, NOS Stage prefix: Initial diagnosis Prostate specific antigen (PSA) range: Less than 10 Gleason primary pattern: 4 Gleason secondary pattern: 3 Gleason score: 7 Histologic grading system: 5 grade system Number of biopsy cores examined: 16 Number of biopsy cores positive: 5 Location of positive needle core biopsies: Both sides   02/13/2023 PET scan   PSMA PET 1. Intense activity at the posterior apex of the prostate gland consistent with primary prostate adenocarcinoma. 2. No evidence of metastatic adenopathy in the pelvis or periaortic retroperitoneum. 3. No evidence of visceral metastasis or skeletal metastasis. 4.  Aortic Atherosclerosis (ICD10-I70.0).   02/19/2023 Initial Diagnosis   Primary prostate adenocarcinoma (HCC)    Tumor Marker   PSA 10/04/18 3.65 05/23/19 4.23 10/04/22 9.48      MEDICAL HISTORY:  Past Medical History:  Diagnosis Date   Allergic rhinitis    Arthritis    CAD (coronary artery disease)    post 3.5 x 15mm xience durg-eluting stent to distal right coronary artery   Chronic LBP    Elevated PSA    Esophageal stricture    status post dilatation 10-08-08   Essential hypertension, benign    External hemorrhoids    GERD (gastroesophageal reflux disease)    History of esophageal stricture  Hypercholesterolemia    Impaired fasting glucose    Shingles    Skin cancer    Sleep apnea     SURGICAL HISTORY: Past Surgical History:  Procedure Laterality Date   APPENDECTOMY      CORONARY ANGIOPLASTY WITH STENT PLACEMENT  2010   95% blockage   right inguinal herniorrhaphy     TONSILLECTOMY      SOCIAL HISTORY: Social History   Socioeconomic History   Marital status: Married    Spouse name: Not on file   Number of children: Not on file   Years of education: Not on file   Highest education level: Not on file  Occupational History   Not on file  Tobacco Use   Smoking status: Former   Smokeless tobacco: Never  Vaping Use   Vaping status: Never Used  Substance and Sexual Activity   Alcohol use: Yes    Alcohol/week: 0.0 standard drinks of alcohol    Comment: occas   Drug use: No   Sexual activity: Not on file  Other Topics Concern   Not on file  Social History Narrative   Not on file   Social Determinants of Health   Financial Resource Strain: Not on file  Food Insecurity: Not on file  Transportation Needs: Not on file  Physical Activity: Not on file  Stress: Not on file  Social Connections: Not on file  Intimate Partner Violence: Not on file    FAMILY HISTORY: Family History  Problem Relation Age of Onset   Alzheimer's disease Mother    Heart attack Father        MI at 85, 79   Hypertension Father    Diabetes Mellitus II Father    Healthy Sister    Heart attack Maternal Grandfather    Heart attack Paternal Grandfather    Healthy Daughter    Healthy Daughter     ALLERGIES:  is allergic to bee venom and codeine.  MEDICATIONS:  Current Outpatient Medications  Medication Sig Dispense Refill   cetirizine (ZYRTEC) 5 MG chewable tablet Chew 5 mg by mouth daily.     ciclopirox (PENLAC) 8 % solution Apply topically.     EPINEPHrine 0.3 mg/0.3 mL IJ SOAJ injection SMARTSIG:Milliliter(s) IM     ezetimibe (ZETIA) 10 MG tablet Take 10 mg by mouth daily.     fluticasone (FLONASE) 50 MCG/ACT nasal spray HOLD RIGHT NOW     imiquimod (ALDARA) 5 % cream Apply to affected areas daily x 5-7 days. Repeat if needed.     ketoconazole (NIZORAL) 2 %  shampoo SMARTSIG:Topical 2-3 Times Weekly     LIPITOR 40 MG tablet TAKE ONE TABLET BY MOUTH DAILY. 30 tablet 2   loratadine (CLARITIN) 10 MG tablet Take 10 mg by mouth daily.     losartan (COZAAR) 100 MG tablet Take 100 mg by mouth daily.     meloxicam (MOBIC) 15 MG tablet Take 15 mg by mouth as needed.     nitroGLYCERIN (NITROSTAT) 0.4 MG SL tablet Place 0.4 mg under the tongue every 5 (five) minutes as needed for chest pain (chest pain).     pantoprazole (PROTONIX) 40 MG tablet Take 40 mg by mouth as needed.     Psyllium (METAMUCIL PO) Take 1 Dose by mouth daily.     ZINC SULFATE PO Take 100 mg by mouth daily.     zolpidem (AMBIEN) 10 MG tablet Take 10 mg by mouth at bedtime as needed.     No current  facility-administered medications for this visit.    REVIEW OF SYSTEMS:   All relevant systems were reviewed with the patient and are negative.  PHYSICAL EXAMINATION: ECOG PERFORMANCE STATUS: 0 - Asymptomatic  GENERAL: alert, no distress and comfortable   LABORATORY DATA:  I have reviewed the data as listed Lab Results  Component Value Date   WBC 9.4 04/12/2017   HGB 18.0 (H) 04/12/2017   HCT 53.1 (H) 04/12/2017   MCV 93.5 04/12/2017   PLT 231 04/12/2017   No results for input(s): "NA", "K", "CL", "CO2", "GLUCOSE", "BUN", "CREATININE", "CALCIUM", "GFRNONAA", "GFRAA", "PROT", "ALBUMIN", "AST", "ALT", "ALKPHOS", "BILITOT", "BILIDIR", "IBILI" in the last 8760 hours.  RADIOGRAPHIC STUDIES: I have personally reviewed the radiological images as listed and agreed with the findings in the report. NM PET (PSMA) SKULL TO MID THIGH  Result Date: 02/15/2023 CLINICAL DATA:  Prostate carcinoma. Recent diagnosis. High risk disease. EXAM: NUCLEAR MEDICINE PET SKULL BASE TO THIGH TECHNIQUE: 8.4 mCi Flotufolastat (Posluma) was injected intravenously. Full-ring PET imaging was performed from the skull base to thigh after the radiotracer. CT data was obtained and used for attenuation correction and  anatomic localization. COMPARISON:  Prostate MRI 11/21/2019 FINDINGS: NECK No radiotracer activity in neck lymph nodes. Incidental CT finding: None. CHEST No radiotracer accumulation within mediastinal or hilar lymph nodes. No suspicious pulmonary nodules on the CT scan. Incidental CT finding: Coronary artery calcification and aortic atherosclerotic calcification. ABDOMEN/PELVIS Prostate: Intense activity at the posterior apex of the prostate gland with SUV max equal 43.5 (image 205). Lymph nodes: No abnormal radiotracer accumulation within pelvic or abdominal nodes. Liver: No evidence of liver metastasis. Incidental CT finding: Abdominal aorta is normal caliber. There is no retroperitoneal or periportal lymphadenopathy. No pelvic lymphadenopathy. SKELETON No focal activity to suggest skeletal metastasis. IMPRESSION: 1. Intense activity at the posterior apex of the prostate gland consistent with primary prostate adenocarcinoma. 2. No evidence of metastatic adenopathy in the pelvis or periaortic retroperitoneum. 3. No evidence of visceral metastasis or skeletal metastasis. 4.  Aortic Atherosclerosis (ICD10-I70.0). Electronically Signed   By: Genevive Bi M.D.   On: 02/15/2023 16:55

## 2023-02-19 NOTE — Progress Notes (Signed)
RN spoke with patient and reviewed upcoming [facility] appointment for 11/26.  All questions answered.

## 2023-02-19 NOTE — Progress Notes (Unsigned)
                               Care Plan Summary  Name: Nicholas Hernandez DOB: [birth year]   Your Medical Team:   Urologist -  Dr. Heloise Purpura, [facility]  Radiation Oncologist - Dr. Margaretmary Dys, [facility]   Medical Oncologist - Dr. Geanie Berlin, [facility]  Recommendations: 1) External beam radiation     * These recommendations are based on information available as of today's consult.      Recommendations may change depending on the results of further tests or exams.    Next Steps: 1) Consider all your options.  Contact Marisue Ivan, your nurse navigator, with any questions or treatment decision.    When appointments need to be scheduled, you will be contacted by [facility] and/or [facility].  Questions?  Please do not hesitate to call Cherlyn Cushing, BSN, RN at [phone] with any questions or concerns.  Marisue Ivan is your Oncology Nurse Navigator and is available to assist you while you're receiving your medical care at [facility].

## 2023-02-20 ENCOUNTER — Inpatient Hospital Stay: Payer: Medicare HMO

## 2023-02-20 ENCOUNTER — Encounter: Payer: Self-pay | Admitting: Radiation Oncology

## 2023-02-20 ENCOUNTER — Ambulatory Visit
Admission: RE | Admit: 2023-02-20 | Discharge: 2023-02-20 | Disposition: A | Discharge status: Home / Self Care | Payer: Medicare HMO | Source: Ambulatory Visit | Attending: Radiation Oncology | Admitting: Radiation Oncology

## 2023-02-20 VITALS — BP 139/70 | HR 66 | Temp 97.5°F | Resp 18 | Ht 70.0 in | Wt 179.8 lb

## 2023-02-20 DIAGNOSIS — C61 Malignant neoplasm of prostate: Secondary | ICD-10-CM

## 2023-02-20 DIAGNOSIS — Z87891 Personal history of nicotine dependence: Secondary | ICD-10-CM | POA: Insufficient documentation

## 2023-02-20 DIAGNOSIS — Z191 Hormone sensitive malignancy status: Secondary | ICD-10-CM | POA: Diagnosis not present

## 2023-02-20 HISTORY — DX: Elevated prostate specific antigen (PSA): R97.20

## 2023-02-20 HISTORY — DX: Sleep apnea, unspecified: G47.30

## 2023-02-20 HISTORY — DX: Pure hypercholesterolemia, unspecified: E78.00

## 2023-02-20 HISTORY — DX: Unspecified osteoarthritis, unspecified site: M19.90

## 2023-02-20 HISTORY — DX: Unspecified malignant neoplasm of skin, unspecified: C44.90

## 2023-02-20 NOTE — Progress Notes (Signed)
Radiation Oncology         [phone] ________________________________  Multidisciplinary Prostate Cancer Clinic  Initial Radiation Oncology Consultation  Name: Nicholas Hernandez MRN: [id number]  Date: 02/20/2023  DOB: 1945/08/21  MV:HQIO, Nicholas Corrigan., MD  Heloise Purpura, MD   REFERRING PHYSICIAN: Heloise Purpura, MD  DIAGNOSIS: 77 y.o. gentleman with stage T1c adenocarcinoma of the prostate with a Gleason's score of 4+3 and a PSA of 9.01.    ICD-10-CM   1. Malignant neoplasm of prostate (HCC)  C61       HISTORY OF PRESENT ILLNESS::Nicholas Hernandez is a 77 y.o. gentleman with a history of elevated PSA since 2019. He was initially referred to Dr. Laverle Hernandez back in 03/2018 for an elevated PSA of 4.3. At that time, he declined biopsy and instead preferred to continue with close monitoring. A repeat PSA in 09/2018 had decreased to 3.65, and he returned to follow up with his PCP, Dr. Clelia Hernandez.  His PSA has been gradually rising over the last 2 years, at 4.69 and 02/13/2021, 5.65 in 03/15/2022, and up to 9.01 on 09/12/2022.  Accordingly, he was referred back for evaluation in urology by Dr. Laverle Hernandez on 10/04/22,  digital rectal examination performed at that time showed mild lobularity at the apex, particularly to the right side with no discrete nodularity or induration.  A repeat PSA at that time remained elevated at 9.48.  Therefore, he underwent prostate MRI on 11/21/22 showing bilateral PI-RADS 4 lesions in the peripheral zone, with ROI #1 showing very suspicious features.        The patient proceeded to MRI fusion biopsy of the prostate on 01/22/23.  The prostate volume measured 34.1 cc.  Out of 20 core biopsies, 5 were positive.  The maximum Gleason score was 4+3, and this was seen in all four cores from the MRI ROI #1 on the right and in the right apex (with perineural invasion).    He underwent staging PSMA PET scan on 02/13/23 showing no evidence of disease outside of the prostate.  The  patient reviewed the biopsy results with his urologist and he has kindly been referred today to the multidisciplinary prostate cancer clinic for presentation of pathology and radiology studies in our conference for discussion of potential radiation treatment options and clinical evaluation.    PREVIOUS RADIATION THERAPY: No  PAST MEDICAL HISTORY:  has a past medical history of Allergic rhinitis, CAD (coronary artery disease), Chronic LBP, Esophageal stricture, Essential hypertension, benign, External hemorrhoids, GERD (gastroesophageal reflux disease), History of esophageal stricture, HLD (hyperlipidemia), Impaired fasting glucose, and Shingles.    PAST SURGICAL HISTORY: Past Surgical History:  Procedure Laterality Date   APPENDECTOMY     right inguinal herniorrhaphy     TONSILLECTOMY      FAMILY HISTORY: family history includes Diabetes Mellitus II in his father; Healthy in his daughter, daughter, and sister; Heart attack in his father, maternal grandfather, and paternal grandfather; Hypertension in his father.  SOCIAL HISTORY:  reports that he has quit smoking. He has never used smokeless tobacco. He reports current alcohol use. He reports that he does not use drugs.  ALLERGIES: Bee venom and Codeine  MEDICATIONS:  Current Outpatient Medications  Medication Sig Dispense Refill   aspirin 81 MG tablet Take 81 mg by mouth daily.     cetirizine (ZYRTEC) 10 MG tablet Take 10 mg by mouth daily.     ezetimibe (ZETIA) 10 MG tablet Take 10 mg by mouth daily.  fluticasone (FLONASE) 50 MCG/ACT nasal spray HOLD RIGHT NOW     LIPITOR 40 MG tablet TAKE ONE TABLET BY MOUTH DAILY. 30 tablet 2   losartan (COZAAR) 100 MG tablet Take 100 mg by mouth daily.     meloxicam (MOBIC) 15 MG tablet Take 15 mg by mouth as needed.     nitroGLYCERIN (NITROSTAT) 0.4 MG SL tablet Place 0.4 mg under the tongue every 5 (five) minutes as needed for chest pain (chest pain).     pantoprazole (PROTONIX) 40 MG tablet  Take 40 mg by mouth as needed.     Psyllium (METAMUCIL PO) Take 1 Dose by mouth daily.     ZINC SULFATE PO Take 100 mg by mouth daily.     zolpidem (AMBIEN) 5 MG tablet Take 5 mg by mouth at bedtime as needed for sleep.     No current facility-administered medications for this encounter.    REVIEW OF SYSTEMS:  On review of systems, the patient reports that he is doing well overall. He denies any chest pain, shortness of breath, cough, fevers, chills, night sweats, unintended weight changes. He denies any bowel disturbances, and denies abdominal pain, nausea or vomiting. He denies any new musculoskeletal or joint aches or pains. His IPSS was 10, indicating moderate urinary symptoms. His SHIM was 12 (with medication), indicating he has moderate erectile dysfunction. A complete review of systems is obtained and is otherwise negative.   PHYSICAL EXAM:  Wt Readings from Last 3 Encounters:  01/23/23 185 lb (83.9 kg)  12/27/22 185 lb (83.9 kg)  07/03/22 187 lb 12.8 oz (85.2 kg)   Temp Readings from Last 3 Encounters:  05/12/19 (!) 97.5 F (36.4 C)  04/20/17 97.8 F (36.6 C) (Oral)  04/12/17 97.6 F (36.4 C) (Oral)   BP Readings from Last 3 Encounters:  01/23/23 120/64  12/27/22 136/80  07/03/22 130/74   Pulse Readings from Last 3 Encounters:  07/03/22 68  06/30/20 64  06/07/20 (!) 57    /10  In general this is a well appearing Caucasian man in no acute distress. He's alert and oriented x4 and appropriate throughout the examination. Cardiopulmonary assessment is negative for acute distress and he exhibits normal effort.    KPS = 100  100 - Normal; no complaints; no evidence of disease. 90   - Able to carry on normal activity; minor signs or symptoms of disease. 80   - Normal activity with effort; some signs or symptoms of disease. 68   - Cares for self; unable to carry on normal activity or to do active work. 60   - Requires occasional assistance, but is able to care for most of  his personal needs. 50   - Requires considerable assistance and frequent medical care. 40   - Disabled; requires special care and assistance. 30   - Severely disabled; hospital admission is indicated although death not imminent. 20   - Very sick; hospital admission necessary; active supportive treatment necessary. 10   - Moribund; fatal processes progressing rapidly. 0     - Dead  Karnofsky DA, Abelmann WH, Craver LS and Burchenal [facility] [phone]) The use of the nitrogen mustards in the palliative treatment of carcinoma: with particular reference to bronchogenic carcinoma Cancer 1 [id number]   LABORATORY DATA:  Lab Results  Component Value Date   WBC 9.4 04/12/2017   HGB 18.0 (H) 04/12/2017   HCT 53.1 (H) 04/12/2017   MCV 93.5 04/12/2017   PLT 231 04/12/2017   Lab  Results  Component Value Date   NA 141 02/27/2017   K 4.0 02/27/2017   CL 104 10/10/2008   CO2 26 02/27/2017   Lab Results  Component Value Date   ALT 23 02/27/2017   AST 27 02/27/2017   ALKPHOS 137 02/27/2017   BILITOT 1.83 (H) 02/27/2017     RADIOGRAPHY: NM PET (PSMA) SKULL TO MID THIGH  Result Date: 02/15/2023 CLINICAL DATA:  Prostate carcinoma. Recent diagnosis. High risk disease. EXAM: NUCLEAR MEDICINE PET SKULL BASE TO THIGH TECHNIQUE: 8.4 mCi Flotufolastat (Posluma) was injected intravenously. Full-ring PET imaging was performed from the skull base to thigh after the radiotracer. CT data was obtained and used for attenuation correction and anatomic localization. COMPARISON:  Prostate MRI 11/21/2019 FINDINGS: NECK No radiotracer activity in neck lymph nodes. Incidental CT finding: None. CHEST No radiotracer accumulation within mediastinal or hilar lymph nodes. No suspicious pulmonary nodules on the CT scan. Incidental CT finding: Coronary artery calcification and aortic atherosclerotic calcification. ABDOMEN/PELVIS Prostate: Intense activity at the posterior apex of the prostate gland with SUV max equal 43.5 (image 205).  Lymph nodes: No abnormal radiotracer accumulation within pelvic or abdominal nodes. Liver: No evidence of liver metastasis. Incidental CT finding: Abdominal aorta is normal caliber. There is no retroperitoneal or periportal lymphadenopathy. No pelvic lymphadenopathy. SKELETON No focal activity to suggest skeletal metastasis. IMPRESSION: 1. Intense activity at the posterior apex of the prostate gland consistent with primary prostate adenocarcinoma. 2. No evidence of metastatic adenopathy in the pelvis or periaortic retroperitoneum. 3. No evidence of visceral metastasis or skeletal metastasis. 4.  Aortic Atherosclerosis (ICD10-I70.0). Electronically Signed   By: Genevive Bi M.D.   On: 02/15/2023 16:55      IMPRESSION/PLAN: 77 y.o. gentleman with Stage T1c adenocarcinoma of the prostate with a Gleason score of 4+3 and a PSA of 9.01.    We discussed the patient's workup and outlined the nature of prostate cancer in this setting. The patient's T stage, Gleason's score, and PSA put him into the unfavorable intermediate risk group. Accordingly, he is eligible for a variety of potential treatment options including prostatectomy, brachytherapy, or 5.5 weeks of external radiation +/- ST-ADT. We discussed the available radiation techniques, and focused on the details and logistics of delivery. We discussed and outlined the risks, benefits, short and long-term effects associated with radiotherapy and compared and contrasted these with prostatectomy. We discussed the role of SpaceOAR gel in reducing the rectal toxicity associated with radiotherapy.  We also discussed the role of ADT in the treatment of unfavorable intermediate risk prostate cancer and the associated side effects that could be expected with this therapy.  He was encouraged to ask questions that were answered to his stated satisfaction.  At the end of the conversation the patient is interested in moving forward with 5.5 weeks IMRT without ADT. We will  share our discussion with Dr. Laverle Hernandez and make arrangements for fiducial markers and SpaceOAR gel placement, first available, prior to simulation, to reduce rectal toxicity from radiotherapy. The patient appears to have a good understanding of his disease and our treatment recommendations which are of curative intent and is in agreement with the stated plan.  Therefore, we will move forward with treatment planning accordingly, in anticipation of beginning IMRT in the near future.  We enjoyed meeting him and his family today and look forward to continuing to participate in his care. . We personally spent 60 minutes in this encounter including chart review, reviewing radiological studies, meeting face-to-face with the  patient, entering orders and completing documentation.    Marguarite Arbour, PA-C    Margaretmary Dys, MD  [facility]  Radiation Oncology Direct Dial: [phone]  Fax: [phone] [email or web address]  Skype  [email or web address]   This document serves as a record of services personally performed by Margaretmary Dys, MD and Marcello Fennel, PA-C. It was created on their behalf by Mickie Bail, a trained medical scribe. The creation of this record is based on the scribe's personal observations and the provider's statements to them. This document has been checked and approved by the attending provider.

## 2023-02-23 NOTE — Addendum Note (Signed)
Encounter addended by: Heloise Purpura, MD on: 02/23/2023 3:23 PM  Actions taken: Clinical Note Signed

## 2023-02-23 NOTE — Consult Note (Signed)
Multi-Disciplinary Clinic     02/20/2023   --------------------------------------------------------------------------------   Nicholas Hernandez  MRN: [id number]  DOB: 1946/03/07, 77 year old Male  SSN:    PRIMARY CARE:  W Leola Brazil, MD  PRIMARY CARE FAX:  [phone]  REFERRING:  Azucena Kuba  PROVIDER:  Heloise Purpura, M.D.  LOCATION:  [facility] [phone]     --------------------------------------------------------------------------------   CC/HPI: CC: Prostate Cancer   PCP: Dr. Eric Form  Location of consult: [facility] - Prostate Cancer Multidisciplinary Clinic   Mr. Mound is a 77 year old gentleman with a history of CAD s/p cardiac stent placement in 2010. I initially saw him for an elevated PSA of 4.3 at age 24. His repeat PSA was 3.9 and he elected to monitor this rather than proceed with a biopsy. His PSA then increased to 9.48 in July of 2024. An MRI of the prostate on 11/21/22 confirmed a 1.4 cm PI-RADS 4 lesion of the right apex/mid peripheral zone and another 1.0 cm PI-RADS 4 lesion of the left base peripheral zone. An MR/US fusion biopsy was then performed on 01/22/23 and confirmed Gleason 4+3=7 adenocarcinoma with all 4 biopsies of the right sided ROI positive and 1 out of 12 systematic biopsies for a total of 5 out of 20 positive biopsies.   Family history: None.   Imaging studies:  MRI (11/21/22) - No EPE, SVI, LAD, or bone lesions.  PSMA PET scan (02/13/2023) -uptake in the vicinity of the right region of interest on MRI. No evidence of metastatic disease.   PMH: He has a history of CAD s/p cardiac stent placement in 2010 (on ASA 81 mg), hypertension, hyperlipidemia, GERD, and sleep apnea.  PSH: Appendectomy and hernia repair.   TNM stage: cT1c N0 M0  PSA: 9.48  Gleason score: 4+3=7 (GG 3)  Biopsy (01/22/23): 5/20 cores positive  Left: Benign  Right: R apex (50%, 4+3=7, PNI)  ROI 1: 4/4 cores positive (4+3=7 in  60%, 5%, 50%, and 50%)  ROI 2: Benign  Prostate volume: 34.1 cc   Nomogram  OC disease: 36%  EPE: 61%  SVI: 10%  LNI: 12%  PFS (5 year, 10 year): 59%, 42%   Urinary function: IPSS is 10.  Erectile function: SHIM score is 12. He does use sildenafil.     ALLERGIES: Bee stings Codeine    MEDICATIONS: Diazepam 10 mg tablet Take 10 mg 30-60 minutes prior to your procedure  Levofloxacin 750 mg tablet Please take one tablet the morning of your biopsy.  Zyrtec  Atorvastatin Calcium 40 mg tablet  Ezetimibe 10 mg tablet  Fluticasone Propionate  Loratadine 10 mg tablet  Losartan Potassium 100 mg tablet  Meloxicam  Sildenafil Citrate  Zolpidem Tartrate     GU PSH: Prostate Needle Biopsy - 01/22/2023     NON-GU PSH: Appendectomy Cardiac Stent Placement - 2010 Hernia Repair Surgical Pathology, Gross And Microscopic Examination For Prostate Needle - 01/22/2023 Tonsillectomy     GU PMH: Elevated PSA - 01/22/2023, - 10/04/2022, - 2020      PMH Notes:   1) Elevated PSA: He presented to me initially in January at age 46 with an elevated PSA of 4.3. He has no family history of prostate cancer and had never undergone a prostate biopsy previously. His repeat PSA was 3.9 with a free % of 13.6%. His risk of clinically significant prostate cancer was calculated to be 12%. After discussing the options  of a biopsy, he instead elected to monitor his PSA.   ** He has a history of cardiac stent placement in 2010 managed with ASA 81 mg.   NON-GU PMH: Arthritis Coronary Artery Disease GERD Hypercholesterolemia Hypertension Skin Cancer, History Sleep Apnea    FAMILY HISTORY: 2 daughters - Runs in Family Alzheimer's Disease - Mother Heart Attack - Father    Notes: 2 daughters, living   SOCIAL HISTORY: Marital Status: Married Preferred Language: English; Ethnicity: Not Hispanic Or Latino; Race: White Current Smoking Status: Patient does not smoke anymore. Has not smoked since  03/28/1987. Smoked for 20 years. Smoked 1 pack per day.   Tobacco Use Assessment Completed: Used Tobacco in last 30 days? Drinks 2 drinks per day.  Drinks 4+ caffeinated drinks per day. Patient's occupation is/was retired.    REVIEW OF SYSTEMS:    GU Review Male:   Patient denies frequent urination, hard to postpone urination, burning/ pain with urination, get up at night to urinate, leakage of urine, stream starts and stops, trouble starting your streams, and have to strain to urinate .  Gastrointestinal (Lower):   Patient denies diarrhea and constipation.  Gastrointestinal (Upper):   Patient denies nausea and vomiting.  Constitutional:   Patient denies fever, night sweats, weight loss, and fatigue.  Skin:   Patient denies skin rash/ lesion and itching.  Eyes:   Patient denies blurred vision and double vision.  Ears/ Nose/ Throat:   Patient denies sore throat and sinus problems.  Hematologic/Lymphatic:   Patient denies swollen glands and easy bruising.  Cardiovascular:   Patient denies leg swelling and chest pains.  Respiratory:   Patient denies cough and shortness of breath.  Endocrine:   Patient denies excessive thirst.  Musculoskeletal:   Patient denies back pain and joint pain.  Neurological:   Patient denies headaches and dizziness.  Psychologic:   Patient denies anxiety and depression.   VITAL SIGNS: None   MULTI-SYSTEM PHYSICAL EXAMINATION:    Constitutional: Well-nourished. No physical deformities. Normally developed. Good grooming.     Complexity of Data:  Lab Test Review:   PSA  Records Review:   Pathology Reports, Previous Patient Records  X-Ray Review: PET- PSMA Scan: Reviewed Films.  MRI Prostate GSORAD: Reviewed Films.     10/04/22 05/23/19 10/04/18  PSA  Total PSA 9.48 ng/mL 4.23 ng/dl 1.88 ng/mL  Free PSA 4.16 ng/mL    % Free PSA 17 % PSA      PROCEDURES: None   ASSESSMENT:      ICD-10 Details  1 GU:   Prostate Cancer - C61    PLAN:            Schedule Labs: 6 Months - Urinalysis    6 Months - PSA  Return Visit/Planned Activity: 6 Months - Office Visit          Document Letter(s):  Created for Patient: Clinical Summary         Notes:   1. Unfavorable intermediate risk prostate cancer: I did detailed discussion with Mr. Kingman, his wife, and his daughter today regarding his prostate cancer diagnosis and options for management/treatment. The patient was counseled about the natural history of prostate cancer and the standard treatment options that are available for prostate cancer. It was explained to him how his age and life expectancy, clinical stage, Gleason score/prognostic grade group, and PSA (and PSA density) affect his prognosis, the decision to proceed with additional staging studies, as well as how that information influences recommended  treatment strategies. We discussed the roles for active surveillance, radiation therapy, surgical therapy, androgen deprivation, as well as ablative therapy and other investigational options for the treatment of prostate cancer as appropriate to his individual cancer situation. We discussed the risks and benefits of these options with regard to their impact on cancer control and also in terms of potential adverse events, complications, and impact on quality of life particularly related to urinary and sexual function. The patient was encouraged to ask questions throughout the discussion today and all questions were answered to his stated satisfaction. In addition, the patient was provided with and/or directed to appropriate resources and literature for further education about prostate cancer and treatment options. We discussed surgical therapy for prostate cancer including the different available surgical approaches. We discussed, in detail, the risks and expectations of surgery with regard to cancer control, urinary control, and erectile function as well as the expected postoperative recovery process.  Additional risks of surgery including but not limited to bleeding, infection, hernia formation, nerve damage, lymphocele formation, bowel/rectal injury potentially necessitating colostomy, damage to the urinary tract resulting in urine leakage, urethral stricture, and the cardiopulmonary risks such as myocardial infarction, stroke, death, venothromboembolism, etc. were explained. The risk of open surgical conversion for robotic/laparoscopic prostatectomy was also discussed.   Ultimately, he appears to be more interested in proceeding with radiation therapy and likely IMRT without androgen deprivation although we did discuss the potential benefits of this multimodality approach for unfavorable intermediate risk disease. Fortunately, he has relatively low volume disease. He is scheduled to meet with Dr. Kathrynn Running and Dr. Cherly Hensen later this afternoon before making a final decision.   CC: Dr. Eric Form  Dr. Margaretmary Dys  Dr. Geanie Berlin    E & M CODES: We spent 58 minutes dedicated to evaluation and management time, including face to face interaction, discussions on coordination of care, documentation, result review, and discussion with others as applicable.     * Signed by Heloise Purpura, M.D. on 02/21/23 at 9:33 AM (EST*

## 2023-03-02 NOTE — Progress Notes (Signed)
RN left message updating patient that we are still awaiting date for fiducial's, and spaceOAR.  Will continue to follow.

## 2023-03-09 ENCOUNTER — Other Ambulatory Visit: Payer: Self-pay | Admitting: Urology

## 2023-03-09 NOTE — Progress Notes (Signed)
Patient is scheduled for fiducial marker's and spaceOAR gel placement on 04/05/23 followed by CT Simulation on 04/09/23.   RN spoke with patient's wife and confirmed appointments and provided education on upcoming procedure/simulation.   Plan of care in progress.

## 2023-03-27 DIAGNOSIS — N401 Enlarged prostate with lower urinary tract symptoms: Secondary | ICD-10-CM | POA: Diagnosis not present

## 2023-03-27 DIAGNOSIS — Z01812 Encounter for preprocedural laboratory examination: Secondary | ICD-10-CM | POA: Diagnosis not present

## 2023-03-29 DIAGNOSIS — R7301 Impaired fasting glucose: Secondary | ICD-10-CM | POA: Diagnosis not present

## 2023-03-29 DIAGNOSIS — R972 Elevated prostate specific antigen [PSA]: Secondary | ICD-10-CM | POA: Diagnosis not present

## 2023-03-29 DIAGNOSIS — I10 Essential (primary) hypertension: Secondary | ICD-10-CM | POA: Diagnosis not present

## 2023-03-29 DIAGNOSIS — I2510 Atherosclerotic heart disease of native coronary artery without angina pectoris: Secondary | ICD-10-CM | POA: Diagnosis not present

## 2023-03-29 DIAGNOSIS — E785 Hyperlipidemia, unspecified: Secondary | ICD-10-CM | POA: Diagnosis not present

## 2023-03-30 ENCOUNTER — Encounter: Payer: Self-pay | Admitting: Family Medicine

## 2023-03-30 ENCOUNTER — Ambulatory Visit (HOSPITAL_BASED_OUTPATIENT_CLINIC_OR_DEPARTMENT_OTHER)
Admission: RE | Admit: 2023-03-30 | Discharge: 2023-03-30 | Disposition: A | Discharge status: Home / Self Care | Payer: Medicare HMO | Source: Ambulatory Visit | Attending: Family Medicine | Admitting: Family Medicine

## 2023-03-30 ENCOUNTER — Ambulatory Visit (INDEPENDENT_AMBULATORY_CARE_PROVIDER_SITE_OTHER): Payer: Medicare HMO | Admitting: Family Medicine

## 2023-03-30 VITALS — BP 100/62 | Ht 70.0 in | Wt 179.0 lb

## 2023-03-30 DIAGNOSIS — M67912 Unspecified disorder of synovium and tendon, left shoulder: Secondary | ICD-10-CM | POA: Diagnosis not present

## 2023-03-30 DIAGNOSIS — M25512 Pain in left shoulder: Secondary | ICD-10-CM | POA: Diagnosis not present

## 2023-03-30 DIAGNOSIS — M19012 Primary osteoarthritis, left shoulder: Secondary | ICD-10-CM | POA: Diagnosis not present

## 2023-03-30 MED ORDER — TRIAMCINOLONE ACETONIDE 40 MG/ML IJ SUSP
40.0000 mg | Freq: Once | INTRAMUSCULAR | Status: AC
  Administered 2023-03-30: 40 mg via INTRA_ARTICULAR

## 2023-03-30 NOTE — Progress Notes (Signed)
 CHIEF COMPLAINT: No chief complaint on file.  _____________________________________________________________ SUBJECTIVE  HPI  Pt is a 78 y.o. male here for evaluation of L shoulder pain  Most recently seen 01/23/2023 for his left shoulder, note reviewed: -POCUS 12/27/2022 had shown a small tear of the posterior aspect of the supraspinatus tendon, at that time left shoulder pain was suspected due to supraspinatus tear.  Shoulder has been aching more due to moving house at that time -Underwent landmark guided subacromial bursa injection at that time  No shoulder x-rays available for review  Left shoulder pain returned weeks after receiving bursa injection Nonradiating, no associated numbness/tingling Exacerbated by reaching out/up, has tweaked L shoulder when playing pickleball (plays with R arm, but lifts left arm up to counter balance himself when he is diving for a ball) Would like to return to function where he is golfing 3x/week Therapies tried Has underwent PT in the past, has not been doing exercises as frequently Is interested in another injection today  ------------------------------------------------------------------------------------------------------ Past Medical History:  Diagnosis Date   Allergic rhinitis    Arthritis    CAD (coronary artery disease)    post 3.5 x 15mm xience durg-eluting stent to distal right coronary artery   Chronic LBP    Elevated PSA    Esophageal stricture    status post dilatation 10-08-08   Essential hypertension, benign    External hemorrhoids    GERD (gastroesophageal reflux disease)    History of esophageal stricture    Hypercholesterolemia    Impaired fasting glucose    Shingles    Skin cancer    Sleep apnea     Past Surgical History:  Procedure Laterality Date   APPENDECTOMY     CORONARY ANGIOPLASTY WITH STENT PLACEMENT  2010   95% blockage   right inguinal herniorrhaphy     TONSILLECTOMY        Outpatient Encounter  Medications as of 03/30/2023  Medication Sig   cetirizine (ZYRTEC) 5 MG chewable tablet Chew 5 mg by mouth daily.   ciclopirox (PENLAC) 8 % solution Apply topically.   EPINEPHrine 0.3 mg/0.3 mL IJ SOAJ injection SMARTSIG:Milliliter(s) IM   ezetimibe (ZETIA) 10 MG tablet Take 10 mg by mouth daily.   fluticasone (FLONASE) 50 MCG/ACT nasal spray HOLD RIGHT NOW   imiquimod (ALDARA) 5 % cream Apply to affected areas daily x 5-7 days. Repeat if needed.   ketoconazole (NIZORAL) 2 % shampoo SMARTSIG:Topical 2-3 Times Weekly   LIPITOR 40 MG tablet TAKE ONE TABLET BY MOUTH DAILY.   loratadine (CLARITIN) 10 MG tablet Take 10 mg by mouth daily.   losartan (COZAAR) 100 MG tablet Take 100 mg by mouth daily.   meloxicam (MOBIC) 15 MG tablet Take 15 mg by mouth as needed.   nitroGLYCERIN  (NITROSTAT ) 0.4 MG SL tablet Place 0.4 mg under the tongue every 5 (five) minutes as needed for chest pain (chest pain).   pantoprazole (PROTONIX) 40 MG tablet Take 40 mg by mouth as needed.   Psyllium (METAMUCIL PO) Take 1 Dose by mouth daily.   ZINC SULFATE PO Take 100 mg by mouth daily.   zolpidem (AMBIEN) 10 MG tablet Take 10 mg by mouth at bedtime as needed.   No facility-administered encounter medications on file as of 03/30/2023.    ------------------------------------------------------------------------------------------------------  _____________________________________________________________ OBJECTIVE  PHYSICAL EXAM  Today's Vitals   03/30/23 0805  BP: 100/62  Weight: 179 lb (81.2 kg)  Height: 5' 10 (1.778 m)   Body mass index is 25.68 kg/m.  reviewed  General: A+Ox3, no acute distress, well-nourished, appropriate affect CV: pulses 2+ regular, nondiaphoretic, no peripheral edema, cap refill <2sec Lungs: no audible wheezing, non-labored breathing, bilateral chest rise/fall, nontachypneic Skin: warm, well-perfused, non-icteric, no susp lesions or rashes Neuro: Sensation intact, muscle tone wnl, no  atrophy Psych: no signs of depression or anxiety MSK:     L Shoulder:  No deformity, swelling or muscle wasting No scapular winging, scapular dyskinesis present FF 180, abd 180, int 0, ext 90 Tenderness over lateral humerus NTTP over the Oliver, clavicle, ac, coracoid, biceps groove, deltoid, subacromial space, scap spine, musculature, trap, cervical spine Neg neer, hawkins, +empty can, +scarf test, +hornblower, +subscap liftoff, -speeds, -obriens Neg ant drawer, sulcus sign Neg apprehension _____________________________________________________________ ASSESSMENT/PLAN Diagnoses and all orders for this visit:  Dysfunction of left rotator cuff -     DG Shoulder Left; Future -     triamcinolone  acetonide (KENALOG -40) injection 40 mg  Exam today appears most consistent with RTC pathology, suspect tendinosis, strength/ROM intact on exam today. Will obtain XR today for further evaluation. Proceeded with subacromial bursa injection. Discussed benefits of reincorporating PT, Rx declined, stating there is someone he sees that would be able to treat him.   Subacromial Bursa Injection Site: left  After PARQ discussed and verbal consent given, the site was cleaned with alcohol . Steroid injection was performed using sterile technique with 4mL 1% lidocaine , 1mL 40mg /mL kenalog  and 22G 1.5in needle. This was well tolerated and resulted in some relief. Dressing placed and post injection instructions were given including a discussion of likely return of pain today after the anesthetic wears off (with the possibility of worsened pain) until the steroid starts to work in 1-3 days. Call or return to clinic prn if these symptoms worsen or fail to improve as anticipated. Anticipate f/u in 4-6 weeks, sooner as needed. If pain refractory to SAB CSI may benefit from intraarticular CSI, shoulder XR pending. All questions answered. Return precautions discussed. Patient verbalized understanding and is in agreement with  plan  Electronically signed by: Mabrey Howland W Dorothyann Mourer, MD 03/30/2023 7:50 AM

## 2023-04-02 ENCOUNTER — Encounter (HOSPITAL_BASED_OUTPATIENT_CLINIC_OR_DEPARTMENT_OTHER): Payer: Self-pay | Admitting: Urology

## 2023-04-02 DIAGNOSIS — M25511 Pain in right shoulder: Secondary | ICD-10-CM | POA: Diagnosis not present

## 2023-04-02 DIAGNOSIS — M25512 Pain in left shoulder: Secondary | ICD-10-CM | POA: Diagnosis not present

## 2023-04-02 NOTE — Progress Notes (Addendum)
 Spoke w/ via phone for pre-op interview--- [facility] needs dos---- ISTAT per anesthesia        Lab results------EKG in Epic dated 07/03/22. COVID test -----patient states asymptomatic no test needed Arrive at -------0530 NPO after MN NO Solid Food.   Med rec completed Medications to take morning of surgery -----NONE Diabetic medication ----- Patient instructed no nail polish to be worn day of surgery Patient instructed to bring photo id and insurance card day of surgery Patient aware to have Driver (ride ) / caregiver    for 24 hours after surgery - Wife Nicholas Hernandez Patient Special Instructions ----- Bring CPAP day of surgery. Pre-Op special Instructions -----FLEETS enema night before surgery. Patient verbalized understanding of instructions that were given at this phone interview. Patient denies chest pain, sob, fever, cough at the interview.   LOV with cardiologist Dr Wonda 07/03/22, pt UTD on visit follow up in 2 years.

## 2023-04-04 DIAGNOSIS — M25512 Pain in left shoulder: Secondary | ICD-10-CM | POA: Diagnosis not present

## 2023-04-04 DIAGNOSIS — I2510 Atherosclerotic heart disease of native coronary artery without angina pectoris: Secondary | ICD-10-CM | POA: Diagnosis not present

## 2023-04-04 DIAGNOSIS — C61 Malignant neoplasm of prostate: Secondary | ICD-10-CM | POA: Diagnosis not present

## 2023-04-04 DIAGNOSIS — Z1331 Encounter for screening for depression: Secondary | ICD-10-CM | POA: Diagnosis not present

## 2023-04-04 DIAGNOSIS — I10 Essential (primary) hypertension: Secondary | ICD-10-CM | POA: Diagnosis not present

## 2023-04-04 DIAGNOSIS — R7301 Impaired fasting glucose: Secondary | ICD-10-CM | POA: Diagnosis not present

## 2023-04-04 DIAGNOSIS — G4733 Obstructive sleep apnea (adult) (pediatric): Secondary | ICD-10-CM | POA: Diagnosis not present

## 2023-04-04 DIAGNOSIS — Z8719 Personal history of other diseases of the digestive system: Secondary | ICD-10-CM | POA: Diagnosis not present

## 2023-04-04 DIAGNOSIS — Z0000 Encounter for general adult medical examination without abnormal findings: Secondary | ICD-10-CM | POA: Diagnosis not present

## 2023-04-04 DIAGNOSIS — J309 Allergic rhinitis, unspecified: Secondary | ICD-10-CM | POA: Diagnosis not present

## 2023-04-04 DIAGNOSIS — Z1339 Encounter for screening examination for other mental health and behavioral disorders: Secondary | ICD-10-CM | POA: Diagnosis not present

## 2023-04-04 DIAGNOSIS — I129 Hypertensive chronic kidney disease with stage 1 through stage 4 chronic kidney disease, or unspecified chronic kidney disease: Secondary | ICD-10-CM | POA: Diagnosis not present

## 2023-04-04 DIAGNOSIS — N1831 Chronic kidney disease, stage 3a: Secondary | ICD-10-CM | POA: Diagnosis not present

## 2023-04-04 DIAGNOSIS — Z23 Encounter for immunization: Secondary | ICD-10-CM | POA: Diagnosis not present

## 2023-04-04 DIAGNOSIS — E785 Hyperlipidemia, unspecified: Secondary | ICD-10-CM | POA: Diagnosis not present

## 2023-04-04 DIAGNOSIS — K219 Gastro-esophageal reflux disease without esophagitis: Secondary | ICD-10-CM | POA: Diagnosis not present

## 2023-04-04 DIAGNOSIS — I6523 Occlusion and stenosis of bilateral carotid arteries: Secondary | ICD-10-CM | POA: Diagnosis not present

## 2023-04-04 DIAGNOSIS — R809 Proteinuria, unspecified: Secondary | ICD-10-CM | POA: Diagnosis not present

## 2023-04-05 ENCOUNTER — Ambulatory Visit (HOSPITAL_BASED_OUTPATIENT_CLINIC_OR_DEPARTMENT_OTHER): Payer: Medicare HMO | Admitting: Anesthesiology

## 2023-04-05 ENCOUNTER — Ambulatory Visit (HOSPITAL_BASED_OUTPATIENT_CLINIC_OR_DEPARTMENT_OTHER)
Admission: RE | Admit: 2023-04-05 | Discharge: 2023-04-05 | Disposition: A | Discharge status: Home / Self Care | Payer: Medicare HMO | Attending: Urology | Admitting: Urology

## 2023-04-05 ENCOUNTER — Encounter (HOSPITAL_BASED_OUTPATIENT_CLINIC_OR_DEPARTMENT_OTHER): Payer: Self-pay | Admitting: Urology

## 2023-04-05 ENCOUNTER — Encounter (HOSPITAL_BASED_OUTPATIENT_CLINIC_OR_DEPARTMENT_OTHER)
Admission: RE | Disposition: A | Discharge status: Home / Self Care | Payer: Self-pay | Source: Home / Self Care | Attending: Urology

## 2023-04-05 ENCOUNTER — Other Ambulatory Visit: Payer: Self-pay

## 2023-04-05 DIAGNOSIS — I10 Essential (primary) hypertension: Secondary | ICD-10-CM | POA: Diagnosis not present

## 2023-04-05 DIAGNOSIS — I2510 Atherosclerotic heart disease of native coronary artery without angina pectoris: Secondary | ICD-10-CM | POA: Diagnosis not present

## 2023-04-05 DIAGNOSIS — C61 Malignant neoplasm of prostate: Secondary | ICD-10-CM | POA: Diagnosis not present

## 2023-04-05 DIAGNOSIS — Z87891 Personal history of nicotine dependence: Secondary | ICD-10-CM | POA: Insufficient documentation

## 2023-04-05 DIAGNOSIS — Z955 Presence of coronary angioplasty implant and graft: Secondary | ICD-10-CM | POA: Insufficient documentation

## 2023-04-05 DIAGNOSIS — K219 Gastro-esophageal reflux disease without esophagitis: Secondary | ICD-10-CM | POA: Insufficient documentation

## 2023-04-05 DIAGNOSIS — G4730 Sleep apnea, unspecified: Secondary | ICD-10-CM | POA: Insufficient documentation

## 2023-04-05 HISTORY — PX: GOLD SEED IMPLANT: SHX6343

## 2023-04-05 HISTORY — PX: SPACE OAR INSTILLATION: SHX6769

## 2023-04-05 LAB — POCT I-STAT, CHEM 8
BUN: 23 mg/dL (ref 8–23)
Calcium, Ion: 1.28 mmol/L (ref 1.15–1.40)
Chloride: 105 mmol/L (ref 98–111)
Creatinine, Ser: 1.4 mg/dL — ABNORMAL HIGH (ref 0.61–1.24)
Glucose, Bld: 109 mg/dL — ABNORMAL HIGH (ref 70–99)
HCT: 50 % (ref 39.0–52.0)
Hemoglobin: 17 g/dL (ref 13.0–17.0)
Potassium: 4.2 mmol/L (ref 3.5–5.1)
Sodium: 142 mmol/L (ref 135–145)
TCO2: 25 mmol/L (ref 22–32)

## 2023-04-05 SURGERY — INSERTION, GOLD SEEDS
Anesthesia: Monitor Anesthesia Care | Site: Prostate

## 2023-04-05 MED ORDER — PROPOFOL 10 MG/ML IV BOLUS
INTRAVENOUS | Status: DC | PRN
  Administered 2023-04-05: 30 mg via INTRAVENOUS
  Administered 2023-04-05: 20 mg via INTRAVENOUS
  Administered 2023-04-05 (×2): 10 mg via INTRAVENOUS

## 2023-04-05 MED ORDER — OXYCODONE HCL 5 MG PO TABS: 5.0000 mg | ORAL_TABLET | Freq: Once | ORAL | Status: DC | PRN

## 2023-04-05 MED ORDER — LIDOCAINE 2% (20 MG/ML) 5 ML SYRINGE
INTRAMUSCULAR | Status: DC | PRN
  Administered 2023-04-05: 60 mg via INTRAVENOUS

## 2023-04-05 MED ORDER — ONDANSETRON HCL 4 MG/2ML IJ SOLN
INTRAMUSCULAR | Status: DC | PRN
  Administered 2023-04-05: 4 mg via INTRAVENOUS

## 2023-04-05 MED ORDER — LIDOCAINE HCL 1 % IJ SOLN
INTRAMUSCULAR | Status: DC | PRN
  Administered 2023-04-05: 10 mL

## 2023-04-05 MED ORDER — SODIUM CHLORIDE (PF) 0.9 % IJ SOLN
INTRAMUSCULAR | Status: DC | PRN
  Administered 2023-04-05: 10 mL

## 2023-04-05 MED ORDER — SODIUM CHLORIDE 0.9 % IV SOLN
INTRAVENOUS | Status: DC
Start: 2023-04-05 — End: 2023-04-05

## 2023-04-05 MED ORDER — OXYCODONE HCL 5 MG/5ML PO SOLN: 5.0000 mg | Freq: Once | ORAL | Status: DC | PRN

## 2023-04-05 MED ORDER — PROPOFOL 500 MG/50ML IV EMUL
INTRAVENOUS | Status: AC
  Filled 2023-04-05: qty 50

## 2023-04-05 MED ORDER — CEFAZOLIN SODIUM-DEXTROSE 2-4 GM/100ML-% IV SOLN
2.0000 g | INTRAVENOUS | Status: AC
  Administered 2023-04-05: 2 g via INTRAVENOUS

## 2023-04-05 MED ORDER — ONDANSETRON HCL 4 MG/2ML IJ SOLN: 4.0000 mg | Freq: Once | INTRAMUSCULAR | Status: DC | PRN

## 2023-04-05 MED ORDER — ACETAMINOPHEN 10 MG/ML IV SOLN
1000.0000 mg | Freq: Once | INTRAVENOUS | Status: DC | PRN
Start: 2023-04-05 — End: 2023-04-05

## 2023-04-05 MED ORDER — CEFAZOLIN SODIUM-DEXTROSE 2-4 GM/100ML-% IV SOLN
INTRAVENOUS | Status: AC
  Filled 2023-04-05: qty 100

## 2023-04-05 MED ORDER — TAMSULOSIN HCL 0.4 MG PO CAPS: 0.4000 mg | ORAL_CAPSULE | Freq: Every day | ORAL | 3 refills | Status: AC

## 2023-04-05 MED ORDER — PROPOFOL 10 MG/ML IV BOLUS
INTRAVENOUS | Status: AC
  Filled 2023-04-05: qty 20

## 2023-04-05 MED ORDER — FENTANYL CITRATE (PF) 100 MCG/2ML IJ SOLN
25.0000 ug | INTRAMUSCULAR | Status: DC | PRN
Start: 2023-04-05 — End: 2023-04-05

## 2023-04-05 MED ORDER — FENTANYL CITRATE (PF) 100 MCG/2ML IJ SOLN
INTRAMUSCULAR | Status: AC
  Filled 2023-04-05: qty 2

## 2023-04-05 MED ORDER — FENTANYL CITRATE (PF) 100 MCG/2ML IJ SOLN
INTRAMUSCULAR | Status: DC | PRN
  Administered 2023-04-05: 50 ug via INTRAVENOUS

## 2023-04-05 MED ORDER — PROPOFOL 500 MG/50ML IV EMUL
INTRAVENOUS | Status: DC | PRN
  Administered 2023-04-05: 200 ug/kg/min via INTRAVENOUS

## 2023-04-05 MED ORDER — LIDOCAINE HCL (PF) 2 % IJ SOLN
INTRAMUSCULAR | Status: AC
  Filled 2023-04-05: qty 5

## 2023-04-05 SURGICAL SUPPLY — 25 items
BLADE CLIPPER SENSICLIP SURGIC (BLADE) ×2 IMPLANT
CNTNR URN SCR LID CUP LEK RST (MISCELLANEOUS) ×2 IMPLANT
COVER BACK TABLE 60X90IN (DRAPES) ×2 IMPLANT
DRSG IV TEGADERM 3.5X4.5 STRL (GAUZE/BANDAGES/DRESSINGS) IMPLANT
DRSG TEGADERM 4X4.75 (GAUZE/BANDAGES/DRESSINGS) ×2 IMPLANT
DRSG TEGADERM 8X12 (GAUZE/BANDAGES/DRESSINGS) ×2 IMPLANT
GAUZE SPONGE 4X4 12PLY STRL (GAUZE/BANDAGES/DRESSINGS) ×2 IMPLANT
GLOVE BIO SURGEON STRL SZ7.5 (GLOVE) ×2 IMPLANT
GLOVE SURG ORTHO 8.5 STRL (GLOVE) ×2 IMPLANT
IMPL SPACEOAR VUE SYSTEM (Spacer) ×2 IMPLANT
IMPLANT SPACEOAR VUE SYSTEM (Spacer) ×2 IMPLANT
KIT TURNOVER CYSTO (KITS) ×2 IMPLANT
MARKER GOLD PRELOAD 1.2X3 (Urological Implant) ×2 IMPLANT
MARKER SKIN DUAL TIP RULER LAB (MISCELLANEOUS) ×2 IMPLANT
NDL SPNL 22GX3.5 QUINCKE BK (NEEDLE) ×2 IMPLANT
NEEDLE SPNL 22GX3.5 QUINCKE BK (NEEDLE) ×2 IMPLANT
SEED GOLD PRELOAD 1.2X3 (Urological Implant) ×2 IMPLANT
SHEATH ULTRASOUND LF (SHEATH) IMPLANT
SHEATH ULTRASOUND LTX NONSTRL (SHEATH) IMPLANT
SLEEVE SCD COMPRESS KNEE MED (STOCKING) ×2 IMPLANT
SURGILUBE 2OZ TUBE FLIPTOP (MISCELLANEOUS) ×2 IMPLANT
SYR 10ML LL (SYRINGE) ×2 IMPLANT
SYR CONTROL 10ML LL (SYRINGE) ×2 IMPLANT
TOWEL OR 17X24 6PK STRL BLUE (TOWEL DISPOSABLE) ×2 IMPLANT
UNDERPAD 30X36 HEAVY ABSORB (UNDERPADS AND DIAPERS) ×2 IMPLANT

## 2023-04-05 NOTE — Op Note (Signed)
 Preoperative diagnosis: Clinically localized adenocarcinoma of the prostate   Postoperative diagnosis: Clinically localized adenocarcinoma of the prostate  Procedure: 1) Placement of fiducial markers into prostate                    2) Insertion of SpaceOAR hydrogel   Surgeon: Morene Salines, M.D.  Anesthesia: General  EBL: Minimal  Complications: None  Indication: Nicholas Hernandez is a 78 y.o. gentleman with clinically localized prostate cancer. After discussing management options for treatment, he elected to proceed with radiotherapy. He presents today for the above procedures. The potential risks, complications, alternative options, and expected recovery course have been discussed in detail with the patient and he has provided informed consent to proceed.  Description of procedure: The patient was administered preoperative antibiotics, placed in the dorsal lithotomy position, and prepped and draped in the usual sterile fashion. Next, transrectal ultrasonography was utilized to visualize the prostate. Three gold fiducial markers were then placed into the prostate via transperineal needles under ultrasound guidance at the right apex, right base, and left mid gland under direct ultrasound guidance. A site in the midline was then selected on the perineum for placement of an 18 g needle with saline. The needle was advanced above the rectum and below Denonvillier's fascia to the mid gland and confirmed to be in the midline on transverse imaging. One cc of saline was injected confirming appropriate expansion of this space. A total of 5 cc of saline was then injected to open the space further bilaterally. The saline syringe was then removed and the SpaceOAR hydrogel was injected with good distribution bilaterally. He tolerated the procedure well and without complications. He was given a voiding trial prior to discharge from the PACU.

## 2023-04-05 NOTE — Discharge Instructions (Addendum)
 GENERAL POST-OPERATIVE PATIENT INSTRUCTIONS   FOLLOW-UP:  Please make an appointment with your physician in 2 week(s).  Call your physician immediately if you have any fevers greater than 102.5, drainage from you wound that is not clear or looks infected, persistent bleeding, increasing abdominal pain, problems urinating, or persistent nausea/vomiting.    WOUND CARE INSTRUCTIONS:  Keep a dry clean dressing on the wound if there is drainage. The initial bandage may be removed after 24 hours.  Once the wound has quit draining you may leave it open to air.  If clothing rubs against the wound or causes irritation and the wound is not draining you may cover it with a dry dressing during the daytime.  Try to keep the wound dry and avoid ointments on the wound unless directed to do so.  If the wound becomes bright red and painful or starts to drain infected material that is not clear, please contact your physician immediately.  If the wound is mildly pink and has a thick firm ridge underneath it, this is normal, and is referred to as a healing ridge.  This will resolve over the next 4-6 weeks.  DIET:  You may eat any foods that you can tolerate.  It is a good idea to eat a high fiber diet and take in plenty of fluids to prevent constipation.  If you do become constipated you may want to take a mild laxative or take ducolax tablets on a daily basis until your bowel habits are regular.  Constipation can be very uncomfortable, along with straining, after recent surgery.  ACTIVITY:  You are encouraged to cough and deep breath or use your incentive spirometer if you were given one, every 15-30 minutes when awake.  This will help prevent respiratory complications and low grade fevers post-operatively if you had a general anesthetic.  You may want to hug a pillow when coughing and sneezing to add additional support to the surgical area, if you had abdominal or chest surgery, which will decrease pain during these times.   You are encouraged to walk and engage in light activity for the next two weeks.  You should not lift more than 20 pounds during this time frame as it could put you at increased risk for complications.  Twenty pounds is roughly equivalent to a plastic bag of groceries. Wait til tomorrow to shower.  MEDICATIONS:  Try to take narcotic medications and anti-inflammatory medications, such as tylenol , ibuprofen, naprosyn, etc., with food.  This will minimize stomach upset from the medication.  Should you develop nausea and vomiting from the pain medication, or develop a rash, please discontinue the medication and contact your physician.  You should not drive, make important decisions, or operate machinery when taking narcotic pain medication.  QUESTIONS:  Please feel free to call your physician or the hospital operator if you have any questions, and they will be glad to assist you.  For several days the patient:  should increase his fluid intake and limit strenuous activity. he might have mild discomfort at the base of his penis or in his rectum. he might have blood in his urine or blood in his bowel movements.  For 2-3 months he might have blood in his ejaculate (semen).  Call the office immedicately:  for blood clots in the urine or bowel movements,  difficulty urinating,  inability to urinate,  urinary retention,  painful or frequent urination,  fever, chills,  nausea, vomiting, other illness.    Contact [facility]  to check on the status of his biopsy if he has not heard from us  within 7 days.  [facility]:  [phone]    Post Anesthesia Home Care Instructions  Activity: Get plenty of rest for the remainder of the day. A responsible individual must stay with you for 24 hours following the procedure.  For the next 24 hours, DO NOT: -Drive a car -Advertising copywriter -Drink alcoholic beverages -Take any medication unless instructed by your physician -Make any legal  decisions or sign important papers.  Meals: Start with liquid foods such as gelatin or soup. Progress to regular foods as tolerated. Avoid greasy, spicy, heavy foods. If nausea and/or vomiting occur, drink only clear liquids until the nausea and/or vomiting subsides. Call your physician if vomiting continues.  Special Instructions/Symptoms: Your throat may feel dry or sore from the anesthesia or the breathing tube placed in your throat during surgery. If this causes discomfort, gargle with warm salt water. The discomfort should disappear within 24 hours.  *Check Blood pressure before taking antihypertensive medication tonight. If it is less than 100 mmHg, check with physician before taking medication.

## 2023-04-05 NOTE — Interval H&P Note (Signed)
 History and Physical Interval Note:  04/05/2023 7:32 AM  Nicholas Hernandez  has presented today for surgery, with the diagnosis of PROSTATE CANCER.  The various methods of treatment have been discussed with the patient and family. After consideration of risks, benefits and other options for treatment, the patient has consented to  Procedure(s) with comments: GOLD SEED IMPLANT (N/A) - 30 MINUTES NEEDED FOR CASE SPACE OAR INSTILLATION (N/A) as a surgical intervention.  The patient's history has been reviewed, patient examined, no change in status, stable for surgery.  I have reviewed the patient's chart and labs.  Questions were answered to the patient's satisfaction.     Nicholas Hernandez

## 2023-04-05 NOTE — H&P (Signed)
 CC: Prostate Cancer   PCP: Dr. Georgette Gentry  Location of consult: [facility] - [facility]   Nicholas Hernandez is a 78 year old gentleman with a history of CAD s/p cardiac stent placement in 2010. I initially saw him for an elevated PSA of 4.3 at age 28. His repeat PSA was 3.9 and he elected to monitor this rather than proceed with a biopsy. His PSA then increased to 9.48 in July of 2024. An MRI of the prostate on 11/21/22 confirmed a 1.4 cm PI-RADS 4 lesion of the right apex/mid peripheral zone and another 1.0 cm PI-RADS 4 lesion of the left base peripheral zone. An MR/US  fusion biopsy was then performed on 01/22/23 and confirmed Gleason 4+3=7 adenocarcinoma with all 4 biopsies of the right sided ROI positive and 1 out of 12 systematic biopsies for a total of 5 out of 20 positive biopsies.   Family history: None.   Imaging studies:  MRI (11/21/22) - No EPE, SVI, LAD, or bone lesions.  PSMA PET scan (02/13/2023) -uptake in the vicinity of the right region of interest on MRI. No evidence of metastatic disease.   PMH: He has a history of CAD s/p cardiac stent placement in 2010 (on ASA 81 mg), hypertension, hyperlipidemia, GERD, and sleep apnea.  PSH: Appendectomy and hernia repair.   TNM stage: cT1c N0 M0  PSA: 9.48  Gleason score: 4+3=7 (GG 3)  Biopsy (01/22/23): 5/20 cores positive  Left: Benign  Right: R apex (50%, 4+3=7, PNI)  ROI 1: 4/4 cores positive (4+3=7 in 60%, 5%, 50%, and 50%)  ROI 2: Benign  Prostate volume: 34.1 cc   Nomogram  OC disease: 36%  EPE: 61%  SVI: 10%  LNI: 12%  PFS (5 year, 10 year): 59%, 42%   Urinary function: IPSS is 10.  Erectile function: SHIM score is 12. He does use sildenafil.   03/27/23: 78 year old male with Gleason 4+3=7 prostate cancer here today for pre-op history and physical exam in anticipation of fiducial marker and SpaceOAR placement on 04/05/2023 with Dr. Cam. He will be receiving IMRT without ADT. He is  scheduled to have CT simulation on 04/09/23. He is doing well and is without complaint. Pt denies F/C, HA, CP, SOB, N/V, diarrhea/constipation, back pain, flank pain, hematuria, and dysuria.     ALLERGIES: Bee stings Codeine    MEDICATIONS: Diazepam 10 mg tablet Take 10 mg 30-60 minutes prior to your procedure  Levofloxacin 750 mg tablet Please take one tablet the morning of your biopsy.  Zyrtec  Atorvastatin Calcium 40 mg tablet  Ezetimibe 10 mg tablet  Fluticasone Propionate  Loratadine 10 mg tablet  Losartan Potassium 100 mg tablet  Meloxicam  Sildenafil Citrate  Zolpidem Tartrate     GU PSH: Prostate Needle Biopsy - 01/22/2023     NON-GU PSH: Appendectomy Cardiac Stent Placement - 2010 Hernia Repair Surgical Pathology, Gross And Microscopic Examination For Prostate Needle - 01/22/2023 Tonsillectomy     GU PMH: Prostate Cancer - 02/20/2023 Elevated PSA - 01/22/2023, - 10/04/2022, - 2020      PMH Notes:   1) Elevated PSA: He presented to me initially in January at age 21 with an elevated PSA of 4.3. He has no family history of prostate cancer and had never undergone a prostate biopsy previously. His repeat PSA was 3.9 with a free % of 13.6%. His risk of clinically significant prostate cancer was calculated to be 12%. After discussing the options of a biopsy,  he instead elected to monitor his PSA.   ** He has a history of cardiac stent placement in 2010 managed with ASA 81 mg.   NON-GU PMH: Arthritis Coronary Artery Disease GERD Hypercholesterolemia Hypertension Skin Cancer, History Sleep Apnea    FAMILY HISTORY: 2 daughters - Runs in Family Alzheimer's Disease - Mother Heart Attack - Father    Notes: 2 daughters, living   SOCIAL HISTORY: Marital Status: Married Preferred Language: English; Ethnicity: Not Hispanic Or Latino; Race: White Current Smoking Status: Patient does not smoke anymore. Has not smoked since 03/28/1987. Smoked for 20 years. Smoked 1 pack per  day.   Tobacco Use Assessment Completed: Used Tobacco in last 30 days? Drinks 2 drinks per day.  Drinks 4+ caffeinated drinks per day. Patient's occupation is/was retired.    REVIEW OF SYSTEMS:    GU Review Male:   Patient denies frequent urination, hard to postpone urination, burning/ pain with urination, get up at night to urinate, leakage of urine, stream starts and stops, trouble starting your stream, have to strain to urinate , erection problems, and penile pain.  Gastrointestinal (Upper):   Patient denies nausea, vomiting, and indigestion/ heartburn.  Gastrointestinal (Lower):   Patient denies diarrhea and constipation.  Constitutional:   Patient denies fever, night sweats, weight loss, and fatigue.  Skin:   Patient denies skin rash/ lesion and itching.  Eyes:   Patient denies blurred vision and double vision.  Ears/ Nose/ Throat:   Patient denies sore throat and sinus problems.  Hematologic/Lymphatic:   Patient denies swollen glands and easy bruising.  Cardiovascular:   Patient denies leg swelling and chest pains.  Respiratory:   Patient denies shortness of breath and cough.  Endocrine:   Patient denies excessive thirst.  Musculoskeletal:   Patient denies back pain and joint pain.  Neurological:   Patient denies headaches and dizziness.  Psychologic:   Patient denies depression and anxiety.   VITAL SIGNS:      03/27/2023 08:00 AM  Weight 185 lb / 83.91 kg  Height 71 in / 180.34 cm  BP 146/81 mmHg  Pulse 64 /min  BMI 25.8 kg/m   Complexity of Data:  Source Of History:  Patient, Family/Caregiver, Medical Record Summary  Lab Test Review:   PSA  Records Review:   AUA Symptom Score, Pathology Reports, Previous Doctor Records, Previous Hospital Records, Previous Patient Records  Urine Test Review:   Urinalysis  X-Ray Review: PET Scan: Reviewed Report.  MRI Prostate GSORAD: Reviewed Report.     10/04/22 05/23/19 10/04/18  PSA  Total PSA 9.48 ng/mL 4.23 ng/dl 6.34 ng/mL   Free PSA 1.58 ng/mL    % Free PSA 17 % PSA      PROCEDURES:          Visit Complexity - G2211          Urinalysis Dipstick Dipstick Cont'd  Color: Yellow Bilirubin: Neg mg/dL  Appearance: Clear Ketones: Neg mg/dL  Specific Gravity: 8.974 Blood: Neg ery/uL  pH: <=5.0 Protein: Neg mg/dL  Glucose: Neg mg/dL Urobilinogen: 0.2 mg/dL    Nitrites: Neg    Leukocyte Esterase: Neg leu/uL    ASSESSMENT:      ICD-10 Details  1 GU:   Prostate Cancer - C61 Chronic, Threat to Bodily Function     PLAN:           Orders Labs Urine Culture          Schedule Return Visit/Planned Activity: Keep Scheduled Appointment  Document Letter(s):  Created for Patient: Clinical Summary         Notes:   UA today clear. Preop culture sent. There are no changes to his medical history or physical exam today. All questions were answered to the best of my ability. Keep scheduled procedure on 04/05/23.

## 2023-04-05 NOTE — Anesthesia Procedure Notes (Signed)
 Procedure Name: MAC Date/Time: 04/05/2023 7:34 AM  Performed by: Franchot Delon RAMAN, CRNAPre-anesthesia Checklist: Patient identified, Emergency Drugs available, Suction available and Patient being monitored Oxygen  Delivery Method: Simple face mask Placement Confirmation: positive ETCO2 Dental Injury: Teeth and Oropharynx as per pre-operative assessment

## 2023-04-05 NOTE — Transfer of Care (Signed)
 Immediate Anesthesia Transfer of Care Note  Patient: Nicholas Hernandez  Procedure(s) Performed: GOLD SEED IMPLANT (Prostate) SPACE OAR INSTILLATION (Perineum)  Patient Location: PACU  Anesthesia Type:MAC  Level of Consciousness: awake, alert , oriented, and patient cooperative  Airway & Oxygen  Therapy: Patient Spontanous Breathing  Post-op Assessment: Report given to RN and Post -op Vital signs reviewed and stable  Post vital signs: Reviewed and stable  Last Vitals:  Vitals Value Taken Time  BP 95/59 04/05/23 0802  Temp 36.4 C 04/05/23 0802  Pulse 69 04/05/23 0806  Resp 15 04/05/23 0806  SpO2 96 % 04/05/23 0806  Vitals shown include unfiled device data.  Last Pain:  Vitals:   04/05/23 0802  TempSrc:   PainSc: 0-No pain      Patients Stated Pain Goal: 6 (04/05/23 0600)  Complications: No notable events documented.

## 2023-04-05 NOTE — Anesthesia Preprocedure Evaluation (Addendum)
 Anesthesia Evaluation  Patient identified by MRN, date of birth, ID band Patient awake    Reviewed: Allergy & Precautions, NPO status , Patient's Chart, lab work & pertinent test results, reviewed documented beta blocker date and time   History of Anesthesia Complications Negative for: history of anesthetic complications  Airway Mallampati: III  TM Distance: >3 FB     Dental no notable dental hx.    Pulmonary sleep apnea , neg COPD, former smoker   breath sounds clear to auscultation       Cardiovascular hypertension, (-) angina + CAD and + Cardiac Stents  (-) Past MI, (-) CHF and (-) DVT  Rhythm:Regular Rate:Normal     Neuro/Psych neg Seizures    GI/Hepatic ,GERD  ,,(+) neg Cirrhosis        Endo/Other    Renal/GU Renal disease     Musculoskeletal  (+) Arthritis ,    Abdominal   Peds  Hematology   Anesthesia Other Findings   Reproductive/Obstetrics                             Anesthesia Physical Anesthesia Plan  ASA: 2  Anesthesia Plan: MAC   Post-op Pain Management:    Induction: Intravenous  PONV Risk Score and Plan: 1 and Ondansetron  and Propofol  infusion  Airway Management Planned: Natural Airway and Nasal Cannula  Additional Equipment:   Intra-op Plan:   Post-operative Plan:   Informed Consent: I have reviewed the patients History and Physical, chart, labs and discussed the procedure including the risks, benefits and alternatives for the proposed anesthesia with the patient or authorized representative who has indicated his/her understanding and acceptance.     Dental advisory given  Plan Discussed with: CRNA  Anesthesia Plan Comments:         Anesthesia Quick Evaluation

## 2023-04-05 NOTE — Anesthesia Postprocedure Evaluation (Signed)
 Anesthesia Post Note  Patient: Nicholas Hernandez  Procedure(s) Performed: GOLD SEED IMPLANT (Prostate) SPACE OAR INSTILLATION (Perineum)     Patient location during evaluation: PACU Anesthesia Type: MAC Level of consciousness: awake and alert Pain management: pain level controlled Vital Signs Assessment: post-procedure vital signs reviewed and stable Respiratory status: spontaneous breathing, nonlabored ventilation, respiratory function stable and patient connected to nasal cannula oxygen  Cardiovascular status: stable and blood pressure returned to baseline Postop Assessment: no apparent nausea or vomiting Anesthetic complications: no   No notable events documented.  Last Vitals:  Vitals:   04/05/23 0900 04/05/23 0915  BP: 135/71 (!) 141/71  Pulse:  (!) 56  Resp:    Temp:    SpO2:  97%    Last Pain:  Vitals:   04/05/23 0830  TempSrc:   PainSc: 2                  Lynwood MARLA Cornea

## 2023-04-06 ENCOUNTER — Telehealth: Payer: Self-pay | Admitting: *Deleted

## 2023-04-06 DIAGNOSIS — M25511 Pain in right shoulder: Secondary | ICD-10-CM | POA: Diagnosis not present

## 2023-04-06 DIAGNOSIS — M25512 Pain in left shoulder: Secondary | ICD-10-CM | POA: Diagnosis not present

## 2023-04-06 NOTE — Telephone Encounter (Signed)
 CALLED PATIENT TO REMIND OF SIM APPT. FOR 04-09-23- ARRIVAL TIME- 10:45 AM @ [facility], INFORMED PATIENT TO ARRIVE WITH A FULL BLADDER, SPOKE WITH PATIENT'S WIFE- NANCY AND SHE IS AWARE OF THIS APPT. AND THE INSTRUCTIONS

## 2023-04-09 ENCOUNTER — Ambulatory Visit: Payer: Medicare HMO | Admitting: Radiation Oncology

## 2023-04-09 ENCOUNTER — Ambulatory Visit
Admission: RE | Admit: 2023-04-09 | Discharge: 2023-04-09 | Disposition: A | Discharge status: Home / Self Care | Payer: Medicare HMO | Source: Ambulatory Visit | Attending: Radiation Oncology | Admitting: Radiation Oncology

## 2023-04-09 ENCOUNTER — Encounter (HOSPITAL_BASED_OUTPATIENT_CLINIC_OR_DEPARTMENT_OTHER): Payer: Self-pay | Admitting: Urology

## 2023-04-09 DIAGNOSIS — C61 Malignant neoplasm of prostate: Secondary | ICD-10-CM | POA: Insufficient documentation

## 2023-04-09 DIAGNOSIS — Z510 Encounter for antineoplastic radiation therapy: Secondary | ICD-10-CM | POA: Diagnosis not present

## 2023-04-09 DIAGNOSIS — Z191 Hormone sensitive malignancy status: Secondary | ICD-10-CM | POA: Diagnosis not present

## 2023-04-09 NOTE — Progress Notes (Signed)
  Radiation Oncology         (336) [phone] ________________________________  Name: Nicholas Hernandez MRN: [id number]  Date: 04/09/2023  DOB: 03-02-1946  SIMULATION AND TREATMENT PLANNING NOTE    ICD-10-CM   1. Primary prostate adenocarcinoma (HCC)  C61       DIAGNOSIS:  78 y.o. gentleman with stage T1c adenocarcinoma of the prostate with a Gleason's score of 4+3 and a PSA of 9.01.   NARRATIVE:  The patient was brought to the CT Simulation planning suite.  Identity was confirmed.  All relevant records and images related to the planned course of therapy were reviewed.  The patient freely provided informed written consent to proceed with treatment after reviewing the details related to the planned course of therapy. The consent form was witnessed and verified by the simulation staff.  Then, the patient was set-up in a stable reproducible supine position for radiation therapy.  A vacuum lock pillow device was custom fabricated to position his legs in a reproducible immobilized position.  Then, I performed a urethrogram under sterile conditions to identify the prostatic apex.  CT images were obtained.  Surface markings were placed.  The CT images were loaded into the planning software.  Then the prostate target and avoidance structures including the rectum, bladder, bowel and hips were contoured.  Treatment planning then occurred.  The radiation prescription was entered and confirmed.  A total of one complex treatment devices was fabricated. I have requested : Intensity Modulated Radiotherapy (IMRT) is medically necessary for this case for the following reason:  Rectal sparing.SABRA  PLAN:  The patient will receive 70 Gy in 28 fractions.  ________________________________  Donnice FELIX Patrcia, M.D.

## 2023-04-10 DIAGNOSIS — C61 Malignant neoplasm of prostate: Secondary | ICD-10-CM | POA: Diagnosis not present

## 2023-04-10 DIAGNOSIS — Z191 Hormone sensitive malignancy status: Secondary | ICD-10-CM | POA: Diagnosis not present

## 2023-04-10 DIAGNOSIS — Z510 Encounter for antineoplastic radiation therapy: Secondary | ICD-10-CM | POA: Diagnosis not present

## 2023-04-11 DIAGNOSIS — M25511 Pain in right shoulder: Secondary | ICD-10-CM | POA: Diagnosis not present

## 2023-04-11 DIAGNOSIS — M25512 Pain in left shoulder: Secondary | ICD-10-CM | POA: Diagnosis not present

## 2023-04-18 ENCOUNTER — Ambulatory Visit
Admission: RE | Admit: 2023-04-18 | Discharge: 2023-04-18 | Disposition: A | Discharge status: Home / Self Care | Payer: Medicare HMO | Source: Ambulatory Visit | Attending: Radiation Oncology | Admitting: Radiation Oncology

## 2023-04-18 ENCOUNTER — Other Ambulatory Visit: Payer: Self-pay

## 2023-04-18 DIAGNOSIS — Z191 Hormone sensitive malignancy status: Secondary | ICD-10-CM | POA: Diagnosis not present

## 2023-04-18 DIAGNOSIS — Z510 Encounter for antineoplastic radiation therapy: Secondary | ICD-10-CM | POA: Diagnosis not present

## 2023-04-18 DIAGNOSIS — C61 Malignant neoplasm of prostate: Secondary | ICD-10-CM | POA: Diagnosis not present

## 2023-04-18 LAB — RAD ONC ARIA SESSION SUMMARY
Course Elapsed Days: 0
Plan Fractions Treated to Date: 1
Plan Prescribed Dose Per Fraction: 2.5 Gy
Plan Total Fractions Prescribed: 28
Plan Total Prescribed Dose: 70 Gy
Reference Point Dosage Given to Date: 2.5 Gy
Reference Point Session Dosage Given: 2.5 Gy
Session Number: 1

## 2023-04-19 ENCOUNTER — Other Ambulatory Visit: Payer: Self-pay

## 2023-04-19 ENCOUNTER — Ambulatory Visit
Admission: RE | Admit: 2023-04-19 | Discharge: 2023-04-19 | Disposition: A | Discharge status: Home / Self Care | Payer: Medicare HMO | Source: Ambulatory Visit | Attending: Radiation Oncology | Admitting: Radiation Oncology

## 2023-04-19 DIAGNOSIS — C61 Malignant neoplasm of prostate: Secondary | ICD-10-CM | POA: Diagnosis not present

## 2023-04-19 DIAGNOSIS — Z191 Hormone sensitive malignancy status: Secondary | ICD-10-CM | POA: Diagnosis not present

## 2023-04-19 DIAGNOSIS — Z510 Encounter for antineoplastic radiation therapy: Secondary | ICD-10-CM | POA: Diagnosis not present

## 2023-04-19 LAB — RAD ONC ARIA SESSION SUMMARY
Course Elapsed Days: 1
Plan Fractions Treated to Date: 2
Plan Prescribed Dose Per Fraction: 2.5 Gy
Plan Total Fractions Prescribed: 28
Plan Total Prescribed Dose: 70 Gy
Reference Point Dosage Given to Date: 5 Gy
Reference Point Session Dosage Given: 2.5 Gy
Session Number: 2

## 2023-04-20 ENCOUNTER — Ambulatory Visit
Admission: RE | Admit: 2023-04-20 | Discharge: 2023-04-20 | Disposition: A | Discharge status: Home / Self Care | Payer: Medicare HMO | Source: Ambulatory Visit | Attending: Radiation Oncology

## 2023-04-20 ENCOUNTER — Other Ambulatory Visit: Payer: Self-pay

## 2023-04-20 DIAGNOSIS — Z510 Encounter for antineoplastic radiation therapy: Secondary | ICD-10-CM | POA: Diagnosis not present

## 2023-04-20 DIAGNOSIS — Z191 Hormone sensitive malignancy status: Secondary | ICD-10-CM | POA: Diagnosis not present

## 2023-04-20 DIAGNOSIS — C61 Malignant neoplasm of prostate: Secondary | ICD-10-CM | POA: Diagnosis not present

## 2023-04-20 LAB — RAD ONC ARIA SESSION SUMMARY
Course Elapsed Days: 2
Plan Fractions Treated to Date: 3
Plan Prescribed Dose Per Fraction: 2.5 Gy
Plan Total Fractions Prescribed: 28
Plan Total Prescribed Dose: 70 Gy
Reference Point Dosage Given to Date: 7.5 Gy
Reference Point Session Dosage Given: 2.5 Gy
Session Number: 3

## 2023-04-23 ENCOUNTER — Ambulatory Visit
Admission: RE | Admit: 2023-04-23 | Discharge: 2023-04-23 | Disposition: A | Discharge status: Home / Self Care | Payer: Medicare HMO | Source: Ambulatory Visit | Attending: Radiation Oncology

## 2023-04-23 ENCOUNTER — Other Ambulatory Visit: Payer: Self-pay

## 2023-04-23 DIAGNOSIS — C61 Malignant neoplasm of prostate: Secondary | ICD-10-CM | POA: Diagnosis not present

## 2023-04-23 DIAGNOSIS — Z191 Hormone sensitive malignancy status: Secondary | ICD-10-CM | POA: Diagnosis not present

## 2023-04-23 DIAGNOSIS — Z510 Encounter for antineoplastic radiation therapy: Secondary | ICD-10-CM | POA: Diagnosis not present

## 2023-04-23 LAB — RAD ONC ARIA SESSION SUMMARY
Course Elapsed Days: 5
Plan Fractions Treated to Date: 4
Plan Prescribed Dose Per Fraction: 2.5 Gy
Plan Total Fractions Prescribed: 28
Plan Total Prescribed Dose: 70 Gy
Reference Point Dosage Given to Date: 10 Gy
Reference Point Session Dosage Given: 2.5 Gy
Session Number: 4

## 2023-04-24 ENCOUNTER — Ambulatory Visit
Admission: RE | Admit: 2023-04-24 | Discharge: 2023-04-24 | Disposition: A | Discharge status: Home / Self Care | Payer: Medicare HMO | Source: Ambulatory Visit | Attending: Radiation Oncology

## 2023-04-24 ENCOUNTER — Other Ambulatory Visit: Payer: Self-pay

## 2023-04-24 DIAGNOSIS — C61 Malignant neoplasm of prostate: Secondary | ICD-10-CM | POA: Diagnosis not present

## 2023-04-24 DIAGNOSIS — Z510 Encounter for antineoplastic radiation therapy: Secondary | ICD-10-CM | POA: Diagnosis not present

## 2023-04-24 DIAGNOSIS — Z191 Hormone sensitive malignancy status: Secondary | ICD-10-CM | POA: Diagnosis not present

## 2023-04-24 LAB — RAD ONC ARIA SESSION SUMMARY
Course Elapsed Days: 6
Plan Fractions Treated to Date: 5
Plan Prescribed Dose Per Fraction: 2.5 Gy
Plan Total Fractions Prescribed: 28
Plan Total Prescribed Dose: 70 Gy
Reference Point Dosage Given to Date: 12.5 Gy
Reference Point Session Dosage Given: 2.5 Gy
Session Number: 5

## 2023-04-25 ENCOUNTER — Ambulatory Visit
Admission: RE | Admit: 2023-04-25 | Discharge: 2023-04-25 | Disposition: A | Discharge status: Home / Self Care | Payer: Medicare HMO | Source: Ambulatory Visit | Attending: Radiation Oncology | Admitting: Radiation Oncology

## 2023-04-25 ENCOUNTER — Other Ambulatory Visit: Payer: Self-pay

## 2023-04-25 DIAGNOSIS — C61 Malignant neoplasm of prostate: Secondary | ICD-10-CM | POA: Diagnosis not present

## 2023-04-25 DIAGNOSIS — Z510 Encounter for antineoplastic radiation therapy: Secondary | ICD-10-CM | POA: Diagnosis not present

## 2023-04-25 DIAGNOSIS — Z191 Hormone sensitive malignancy status: Secondary | ICD-10-CM | POA: Diagnosis not present

## 2023-04-25 LAB — RAD ONC ARIA SESSION SUMMARY
Course Elapsed Days: 7
Plan Fractions Treated to Date: 6
Plan Prescribed Dose Per Fraction: 2.5 Gy
Plan Total Fractions Prescribed: 28
Plan Total Prescribed Dose: 70 Gy
Reference Point Dosage Given to Date: 15 Gy
Reference Point Session Dosage Given: 2.5 Gy
Session Number: 6

## 2023-04-26 ENCOUNTER — Ambulatory Visit
Admission: RE | Admit: 2023-04-26 | Discharge: 2023-04-26 | Disposition: A | Discharge status: Home / Self Care | Payer: Medicare HMO | Source: Ambulatory Visit | Attending: Radiation Oncology | Admitting: Radiation Oncology

## 2023-04-26 ENCOUNTER — Other Ambulatory Visit: Payer: Self-pay

## 2023-04-26 DIAGNOSIS — Z510 Encounter for antineoplastic radiation therapy: Secondary | ICD-10-CM | POA: Diagnosis not present

## 2023-04-26 DIAGNOSIS — C61 Malignant neoplasm of prostate: Secondary | ICD-10-CM | POA: Diagnosis not present

## 2023-04-26 DIAGNOSIS — Z191 Hormone sensitive malignancy status: Secondary | ICD-10-CM | POA: Diagnosis not present

## 2023-04-26 LAB — RAD ONC ARIA SESSION SUMMARY
Course Elapsed Days: 8
Plan Fractions Treated to Date: 7
Plan Prescribed Dose Per Fraction: 2.5 Gy
Plan Total Fractions Prescribed: 28
Plan Total Prescribed Dose: 70 Gy
Reference Point Dosage Given to Date: 17.5 Gy
Reference Point Session Dosage Given: 2.5 Gy
Session Number: 7

## 2023-04-27 ENCOUNTER — Ambulatory Visit
Admission: RE | Admit: 2023-04-27 | Discharge: 2023-04-27 | Disposition: A | Discharge status: Home / Self Care | Payer: Medicare HMO | Source: Ambulatory Visit | Attending: Radiation Oncology

## 2023-04-27 ENCOUNTER — Ambulatory Visit
Admission: RE | Admit: 2023-04-27 | Discharge: 2023-04-27 | Disposition: A | Discharge status: Home / Self Care | Payer: Medicare HMO | Source: Ambulatory Visit | Attending: Radiation Oncology | Admitting: Radiation Oncology

## 2023-04-27 ENCOUNTER — Other Ambulatory Visit: Payer: Self-pay

## 2023-04-27 DIAGNOSIS — C61 Malignant neoplasm of prostate: Secondary | ICD-10-CM | POA: Diagnosis not present

## 2023-04-27 DIAGNOSIS — Z510 Encounter for antineoplastic radiation therapy: Secondary | ICD-10-CM | POA: Diagnosis not present

## 2023-04-27 DIAGNOSIS — Z191 Hormone sensitive malignancy status: Secondary | ICD-10-CM | POA: Diagnosis not present

## 2023-04-27 LAB — RAD ONC ARIA SESSION SUMMARY
Course Elapsed Days: 9
Plan Fractions Treated to Date: 8
Plan Prescribed Dose Per Fraction: 2.5 Gy
Plan Total Fractions Prescribed: 28
Plan Total Prescribed Dose: 70 Gy
Reference Point Dosage Given to Date: 20 Gy
Reference Point Session Dosage Given: 2.5 Gy
Session Number: 8

## 2023-04-30 ENCOUNTER — Other Ambulatory Visit: Payer: Self-pay

## 2023-04-30 ENCOUNTER — Ambulatory Visit
Admission: RE | Admit: 2023-04-30 | Discharge: 2023-04-30 | Disposition: A | Discharge status: Home / Self Care | Payer: Medicare HMO | Source: Ambulatory Visit | Attending: Radiation Oncology | Admitting: Radiation Oncology

## 2023-04-30 DIAGNOSIS — C61 Malignant neoplasm of prostate: Secondary | ICD-10-CM | POA: Insufficient documentation

## 2023-04-30 DIAGNOSIS — Z510 Encounter for antineoplastic radiation therapy: Secondary | ICD-10-CM | POA: Diagnosis not present

## 2023-04-30 DIAGNOSIS — Z191 Hormone sensitive malignancy status: Secondary | ICD-10-CM | POA: Diagnosis not present

## 2023-04-30 LAB — RAD ONC ARIA SESSION SUMMARY
Course Elapsed Days: 12
Plan Fractions Treated to Date: 9
Plan Prescribed Dose Per Fraction: 2.5 Gy
Plan Total Fractions Prescribed: 28
Plan Total Prescribed Dose: 70 Gy
Reference Point Dosage Given to Date: 22.5 Gy
Reference Point Session Dosage Given: 2.5 Gy
Session Number: 9

## 2023-05-01 ENCOUNTER — Ambulatory Visit
Admission: RE | Admit: 2023-05-01 | Discharge: 2023-05-01 | Disposition: A | Discharge status: Home / Self Care | Payer: Medicare HMO | Source: Ambulatory Visit | Attending: Radiation Oncology

## 2023-05-01 ENCOUNTER — Other Ambulatory Visit: Payer: Self-pay

## 2023-05-01 DIAGNOSIS — C61 Malignant neoplasm of prostate: Secondary | ICD-10-CM | POA: Diagnosis not present

## 2023-05-01 DIAGNOSIS — Z510 Encounter for antineoplastic radiation therapy: Secondary | ICD-10-CM | POA: Diagnosis not present

## 2023-05-01 DIAGNOSIS — Z191 Hormone sensitive malignancy status: Secondary | ICD-10-CM | POA: Diagnosis not present

## 2023-05-01 LAB — RAD ONC ARIA SESSION SUMMARY
Course Elapsed Days: 13
Plan Fractions Treated to Date: 10
Plan Prescribed Dose Per Fraction: 2.5 Gy
Plan Total Fractions Prescribed: 28
Plan Total Prescribed Dose: 70 Gy
Reference Point Dosage Given to Date: 25 Gy
Reference Point Session Dosage Given: 2.5 Gy
Session Number: 10

## 2023-05-02 ENCOUNTER — Ambulatory Visit
Admission: RE | Admit: 2023-05-02 | Discharge: 2023-05-02 | Disposition: A | Discharge status: Home / Self Care | Payer: Medicare HMO | Source: Ambulatory Visit | Attending: Radiation Oncology

## 2023-05-02 ENCOUNTER — Other Ambulatory Visit: Payer: Self-pay

## 2023-05-02 DIAGNOSIS — Z510 Encounter for antineoplastic radiation therapy: Secondary | ICD-10-CM | POA: Diagnosis not present

## 2023-05-02 DIAGNOSIS — C61 Malignant neoplasm of prostate: Secondary | ICD-10-CM | POA: Diagnosis not present

## 2023-05-02 DIAGNOSIS — Z191 Hormone sensitive malignancy status: Secondary | ICD-10-CM | POA: Diagnosis not present

## 2023-05-02 LAB — RAD ONC ARIA SESSION SUMMARY
Course Elapsed Days: 14
Plan Fractions Treated to Date: 11
Plan Prescribed Dose Per Fraction: 2.5 Gy
Plan Total Fractions Prescribed: 28
Plan Total Prescribed Dose: 70 Gy
Reference Point Dosage Given to Date: 27.5 Gy
Reference Point Session Dosage Given: 2.5 Gy
Session Number: 11

## 2023-05-03 ENCOUNTER — Ambulatory Visit
Admission: RE | Admit: 2023-05-03 | Discharge: 2023-05-03 | Disposition: A | Discharge status: Home / Self Care | Payer: Medicare HMO | Source: Ambulatory Visit | Attending: Radiation Oncology | Admitting: Radiation Oncology

## 2023-05-03 ENCOUNTER — Other Ambulatory Visit: Payer: Self-pay

## 2023-05-03 DIAGNOSIS — Z191 Hormone sensitive malignancy status: Secondary | ICD-10-CM | POA: Diagnosis not present

## 2023-05-03 DIAGNOSIS — Z510 Encounter for antineoplastic radiation therapy: Secondary | ICD-10-CM | POA: Diagnosis not present

## 2023-05-03 DIAGNOSIS — C61 Malignant neoplasm of prostate: Secondary | ICD-10-CM | POA: Diagnosis not present

## 2023-05-03 LAB — RAD ONC ARIA SESSION SUMMARY
Course Elapsed Days: 15
Plan Fractions Treated to Date: 12
Plan Prescribed Dose Per Fraction: 2.5 Gy
Plan Total Fractions Prescribed: 28
Plan Total Prescribed Dose: 70 Gy
Reference Point Dosage Given to Date: 30 Gy
Reference Point Session Dosage Given: 2.5 Gy
Session Number: 12

## 2023-05-04 ENCOUNTER — Other Ambulatory Visit: Payer: Self-pay

## 2023-05-04 ENCOUNTER — Ambulatory Visit
Admission: RE | Admit: 2023-05-04 | Discharge: 2023-05-04 | Disposition: A | Discharge status: Home / Self Care | Payer: Medicare HMO | Source: Ambulatory Visit | Attending: Radiation Oncology | Admitting: Radiation Oncology

## 2023-05-04 ENCOUNTER — Ambulatory Visit
Admission: RE | Admit: 2023-05-04 | Discharge: 2023-05-04 | Disposition: A | Discharge status: Home / Self Care | Payer: Medicare HMO | Source: Ambulatory Visit | Attending: Radiation Oncology

## 2023-05-04 DIAGNOSIS — Z191 Hormone sensitive malignancy status: Secondary | ICD-10-CM | POA: Diagnosis not present

## 2023-05-04 DIAGNOSIS — Z510 Encounter for antineoplastic radiation therapy: Secondary | ICD-10-CM | POA: Diagnosis not present

## 2023-05-04 DIAGNOSIS — C61 Malignant neoplasm of prostate: Secondary | ICD-10-CM | POA: Diagnosis not present

## 2023-05-04 LAB — RAD ONC ARIA SESSION SUMMARY
Course Elapsed Days: 16
Plan Fractions Treated to Date: 13
Plan Prescribed Dose Per Fraction: 2.5 Gy
Plan Total Fractions Prescribed: 28
Plan Total Prescribed Dose: 70 Gy
Reference Point Dosage Given to Date: 32.5 Gy
Reference Point Session Dosage Given: 2.5 Gy
Session Number: 13

## 2023-05-07 ENCOUNTER — Ambulatory Visit
Admission: RE | Admit: 2023-05-07 | Discharge: 2023-05-07 | Disposition: A | Discharge status: Home / Self Care | Payer: Medicare HMO | Source: Ambulatory Visit | Attending: Radiation Oncology

## 2023-05-07 ENCOUNTER — Other Ambulatory Visit: Payer: Self-pay

## 2023-05-07 DIAGNOSIS — Z510 Encounter for antineoplastic radiation therapy: Secondary | ICD-10-CM | POA: Diagnosis not present

## 2023-05-07 DIAGNOSIS — C61 Malignant neoplasm of prostate: Secondary | ICD-10-CM | POA: Diagnosis not present

## 2023-05-07 DIAGNOSIS — Z191 Hormone sensitive malignancy status: Secondary | ICD-10-CM | POA: Diagnosis not present

## 2023-05-07 LAB — RAD ONC ARIA SESSION SUMMARY
Course Elapsed Days: 19
Plan Fractions Treated to Date: 14
Plan Prescribed Dose Per Fraction: 2.5 Gy
Plan Total Fractions Prescribed: 28
Plan Total Prescribed Dose: 70 Gy
Reference Point Dosage Given to Date: 35 Gy
Reference Point Session Dosage Given: 2.5 Gy
Session Number: 14

## 2023-05-08 ENCOUNTER — Ambulatory Visit
Admission: RE | Admit: 2023-05-08 | Discharge: 2023-05-08 | Disposition: A | Discharge status: Home / Self Care | Payer: Medicare HMO | Source: Ambulatory Visit | Attending: Radiation Oncology | Admitting: Radiation Oncology

## 2023-05-08 ENCOUNTER — Other Ambulatory Visit: Payer: Self-pay

## 2023-05-08 DIAGNOSIS — C61 Malignant neoplasm of prostate: Secondary | ICD-10-CM | POA: Diagnosis not present

## 2023-05-08 DIAGNOSIS — Z191 Hormone sensitive malignancy status: Secondary | ICD-10-CM | POA: Diagnosis not present

## 2023-05-08 DIAGNOSIS — Z510 Encounter for antineoplastic radiation therapy: Secondary | ICD-10-CM | POA: Diagnosis not present

## 2023-05-08 LAB — RAD ONC ARIA SESSION SUMMARY
Course Elapsed Days: 20
Plan Fractions Treated to Date: 15
Plan Prescribed Dose Per Fraction: 2.5 Gy
Plan Total Fractions Prescribed: 28
Plan Total Prescribed Dose: 70 Gy
Reference Point Dosage Given to Date: 37.5 Gy
Reference Point Session Dosage Given: 2.5 Gy
Session Number: 15

## 2023-05-09 ENCOUNTER — Ambulatory Visit
Admission: RE | Admit: 2023-05-09 | Discharge: 2023-05-09 | Disposition: A | Discharge status: Home / Self Care | Payer: Medicare HMO | Source: Ambulatory Visit | Attending: Radiation Oncology

## 2023-05-09 ENCOUNTER — Other Ambulatory Visit: Payer: Self-pay

## 2023-05-09 DIAGNOSIS — Z191 Hormone sensitive malignancy status: Secondary | ICD-10-CM | POA: Diagnosis not present

## 2023-05-09 DIAGNOSIS — Z510 Encounter for antineoplastic radiation therapy: Secondary | ICD-10-CM | POA: Diagnosis not present

## 2023-05-09 DIAGNOSIS — C61 Malignant neoplasm of prostate: Secondary | ICD-10-CM | POA: Diagnosis not present

## 2023-05-09 LAB — RAD ONC ARIA SESSION SUMMARY
Course Elapsed Days: 21
Plan Fractions Treated to Date: 16
Plan Prescribed Dose Per Fraction: 2.5 Gy
Plan Total Fractions Prescribed: 28
Plan Total Prescribed Dose: 70 Gy
Reference Point Dosage Given to Date: 40 Gy
Reference Point Session Dosage Given: 2.5 Gy
Session Number: 16

## 2023-05-10 ENCOUNTER — Other Ambulatory Visit: Payer: Self-pay

## 2023-05-10 ENCOUNTER — Ambulatory Visit
Admission: RE | Admit: 2023-05-10 | Discharge: 2023-05-10 | Disposition: A | Discharge status: Home / Self Care | Payer: Medicare HMO | Source: Ambulatory Visit | Attending: Radiation Oncology

## 2023-05-10 DIAGNOSIS — C61 Malignant neoplasm of prostate: Secondary | ICD-10-CM | POA: Diagnosis not present

## 2023-05-10 DIAGNOSIS — Z510 Encounter for antineoplastic radiation therapy: Secondary | ICD-10-CM | POA: Diagnosis not present

## 2023-05-10 DIAGNOSIS — Z191 Hormone sensitive malignancy status: Secondary | ICD-10-CM | POA: Diagnosis not present

## 2023-05-10 LAB — RAD ONC ARIA SESSION SUMMARY
Course Elapsed Days: 22
Plan Fractions Treated to Date: 17
Plan Prescribed Dose Per Fraction: 2.5 Gy
Plan Total Fractions Prescribed: 28
Plan Total Prescribed Dose: 70 Gy
Reference Point Dosage Given to Date: 42.5 Gy
Reference Point Session Dosage Given: 2.5 Gy
Session Number: 17

## 2023-05-11 ENCOUNTER — Ambulatory Visit
Admission: RE | Admit: 2023-05-11 | Discharge: 2023-05-11 | Disposition: A | Discharge status: Home / Self Care | Payer: Medicare HMO | Source: Ambulatory Visit | Attending: Radiation Oncology | Admitting: Radiation Oncology

## 2023-05-11 ENCOUNTER — Other Ambulatory Visit: Payer: Self-pay

## 2023-05-11 DIAGNOSIS — C61 Malignant neoplasm of prostate: Secondary | ICD-10-CM | POA: Diagnosis not present

## 2023-05-11 DIAGNOSIS — Z510 Encounter for antineoplastic radiation therapy: Secondary | ICD-10-CM | POA: Diagnosis not present

## 2023-05-11 DIAGNOSIS — Z191 Hormone sensitive malignancy status: Secondary | ICD-10-CM | POA: Diagnosis not present

## 2023-05-11 LAB — RAD ONC ARIA SESSION SUMMARY
Course Elapsed Days: 23
Plan Fractions Treated to Date: 18
Plan Prescribed Dose Per Fraction: 2.5 Gy
Plan Total Fractions Prescribed: 28
Plan Total Prescribed Dose: 70 Gy
Reference Point Dosage Given to Date: 45 Gy
Reference Point Session Dosage Given: 2.5 Gy
Session Number: 18

## 2023-05-14 ENCOUNTER — Other Ambulatory Visit: Payer: Self-pay

## 2023-05-14 ENCOUNTER — Ambulatory Visit
Admission: RE | Admit: 2023-05-14 | Discharge: 2023-05-14 | Disposition: A | Discharge status: Home / Self Care | Payer: Medicare HMO | Source: Ambulatory Visit | Attending: Radiation Oncology

## 2023-05-14 DIAGNOSIS — Z510 Encounter for antineoplastic radiation therapy: Secondary | ICD-10-CM | POA: Diagnosis not present

## 2023-05-14 DIAGNOSIS — C61 Malignant neoplasm of prostate: Secondary | ICD-10-CM | POA: Diagnosis not present

## 2023-05-14 DIAGNOSIS — Z191 Hormone sensitive malignancy status: Secondary | ICD-10-CM | POA: Diagnosis not present

## 2023-05-14 LAB — RAD ONC ARIA SESSION SUMMARY
Course Elapsed Days: 26
Plan Fractions Treated to Date: 19
Plan Prescribed Dose Per Fraction: 2.5 Gy
Plan Total Fractions Prescribed: 28
Plan Total Prescribed Dose: 70 Gy
Reference Point Dosage Given to Date: 47.5 Gy
Reference Point Session Dosage Given: 2.5 Gy
Session Number: 19

## 2023-05-15 ENCOUNTER — Other Ambulatory Visit: Payer: Self-pay

## 2023-05-15 ENCOUNTER — Ambulatory Visit
Admission: RE | Admit: 2023-05-15 | Discharge: 2023-05-15 | Disposition: A | Discharge status: Home / Self Care | Payer: Medicare HMO | Source: Ambulatory Visit | Attending: Radiation Oncology | Admitting: Radiation Oncology

## 2023-05-15 DIAGNOSIS — C61 Malignant neoplasm of prostate: Secondary | ICD-10-CM | POA: Diagnosis not present

## 2023-05-15 DIAGNOSIS — Z191 Hormone sensitive malignancy status: Secondary | ICD-10-CM | POA: Diagnosis not present

## 2023-05-15 DIAGNOSIS — Z510 Encounter for antineoplastic radiation therapy: Secondary | ICD-10-CM | POA: Diagnosis not present

## 2023-05-15 LAB — RAD ONC ARIA SESSION SUMMARY
Course Elapsed Days: 27
Plan Fractions Treated to Date: 20
Plan Prescribed Dose Per Fraction: 2.5 Gy
Plan Total Fractions Prescribed: 28
Plan Total Prescribed Dose: 70 Gy
Reference Point Dosage Given to Date: 50 Gy
Reference Point Session Dosage Given: 2.5 Gy
Session Number: 20

## 2023-05-16 ENCOUNTER — Other Ambulatory Visit: Payer: Self-pay

## 2023-05-16 ENCOUNTER — Ambulatory Visit
Admission: RE | Admit: 2023-05-16 | Discharge: 2023-05-16 | Disposition: A | Discharge status: Home / Self Care | Payer: Medicare HMO | Source: Ambulatory Visit | Attending: Radiation Oncology

## 2023-05-16 DIAGNOSIS — Z191 Hormone sensitive malignancy status: Secondary | ICD-10-CM | POA: Diagnosis not present

## 2023-05-16 DIAGNOSIS — Z510 Encounter for antineoplastic radiation therapy: Secondary | ICD-10-CM | POA: Diagnosis not present

## 2023-05-16 DIAGNOSIS — C61 Malignant neoplasm of prostate: Secondary | ICD-10-CM | POA: Diagnosis not present

## 2023-05-16 LAB — RAD ONC ARIA SESSION SUMMARY
Course Elapsed Days: 28
Plan Fractions Treated to Date: 21
Plan Prescribed Dose Per Fraction: 2.5 Gy
Plan Total Fractions Prescribed: 28
Plan Total Prescribed Dose: 70 Gy
Reference Point Dosage Given to Date: 52.5 Gy
Reference Point Session Dosage Given: 2.5 Gy
Session Number: 21

## 2023-05-17 ENCOUNTER — Other Ambulatory Visit: Payer: Self-pay

## 2023-05-17 ENCOUNTER — Ambulatory Visit
Admission: RE | Admit: 2023-05-17 | Discharge: 2023-05-17 | Disposition: A | Discharge status: Home / Self Care | Payer: Medicare HMO | Source: Ambulatory Visit | Attending: Radiation Oncology

## 2023-05-17 DIAGNOSIS — Z191 Hormone sensitive malignancy status: Secondary | ICD-10-CM | POA: Diagnosis not present

## 2023-05-17 DIAGNOSIS — Z510 Encounter for antineoplastic radiation therapy: Secondary | ICD-10-CM | POA: Diagnosis not present

## 2023-05-17 DIAGNOSIS — C61 Malignant neoplasm of prostate: Secondary | ICD-10-CM | POA: Diagnosis not present

## 2023-05-17 LAB — RAD ONC ARIA SESSION SUMMARY
Course Elapsed Days: 29
Plan Fractions Treated to Date: 22
Plan Prescribed Dose Per Fraction: 2.5 Gy
Plan Total Fractions Prescribed: 28
Plan Total Prescribed Dose: 70 Gy
Reference Point Dosage Given to Date: 55 Gy
Reference Point Session Dosage Given: 2.5 Gy
Session Number: 22

## 2023-05-18 ENCOUNTER — Ambulatory Visit
Admission: RE | Admit: 2023-05-18 | Discharge: 2023-05-18 | Disposition: A | Discharge status: Home / Self Care | Payer: Medicare HMO | Source: Ambulatory Visit | Attending: Radiation Oncology | Admitting: Radiation Oncology

## 2023-05-18 ENCOUNTER — Other Ambulatory Visit: Payer: Self-pay

## 2023-05-18 DIAGNOSIS — C61 Malignant neoplasm of prostate: Secondary | ICD-10-CM | POA: Diagnosis not present

## 2023-05-18 DIAGNOSIS — Z191 Hormone sensitive malignancy status: Secondary | ICD-10-CM | POA: Diagnosis not present

## 2023-05-18 DIAGNOSIS — Z510 Encounter for antineoplastic radiation therapy: Secondary | ICD-10-CM | POA: Diagnosis not present

## 2023-05-18 LAB — RAD ONC ARIA SESSION SUMMARY
Course Elapsed Days: 30
Plan Fractions Treated to Date: 23
Plan Prescribed Dose Per Fraction: 2.5 Gy
Plan Total Fractions Prescribed: 28
Plan Total Prescribed Dose: 70 Gy
Reference Point Dosage Given to Date: 57.5 Gy
Reference Point Session Dosage Given: 2.5 Gy
Session Number: 23

## 2023-05-21 ENCOUNTER — Other Ambulatory Visit: Payer: Self-pay

## 2023-05-21 ENCOUNTER — Ambulatory Visit
Admission: RE | Admit: 2023-05-21 | Discharge: 2023-05-21 | Disposition: A | Discharge status: Home / Self Care | Payer: Medicare HMO | Source: Ambulatory Visit | Attending: Radiation Oncology

## 2023-05-21 DIAGNOSIS — C61 Malignant neoplasm of prostate: Secondary | ICD-10-CM | POA: Diagnosis not present

## 2023-05-21 DIAGNOSIS — Z191 Hormone sensitive malignancy status: Secondary | ICD-10-CM | POA: Diagnosis not present

## 2023-05-21 DIAGNOSIS — Z510 Encounter for antineoplastic radiation therapy: Secondary | ICD-10-CM | POA: Diagnosis not present

## 2023-05-21 LAB — RAD ONC ARIA SESSION SUMMARY
Course Elapsed Days: 33
Plan Fractions Treated to Date: 24
Plan Prescribed Dose Per Fraction: 2.5 Gy
Plan Total Fractions Prescribed: 28
Plan Total Prescribed Dose: 70 Gy
Reference Point Dosage Given to Date: 60 Gy
Reference Point Session Dosage Given: 2.5 Gy
Session Number: 24

## 2023-05-22 ENCOUNTER — Other Ambulatory Visit: Payer: Self-pay

## 2023-05-22 ENCOUNTER — Ambulatory Visit
Admission: RE | Admit: 2023-05-22 | Discharge: 2023-05-22 | Disposition: A | Discharge status: Home / Self Care | Payer: Medicare HMO | Source: Ambulatory Visit | Attending: Radiation Oncology | Admitting: Radiation Oncology

## 2023-05-22 DIAGNOSIS — C61 Malignant neoplasm of prostate: Secondary | ICD-10-CM | POA: Diagnosis not present

## 2023-05-22 DIAGNOSIS — Z191 Hormone sensitive malignancy status: Secondary | ICD-10-CM | POA: Diagnosis not present

## 2023-05-22 DIAGNOSIS — Z510 Encounter for antineoplastic radiation therapy: Secondary | ICD-10-CM | POA: Diagnosis not present

## 2023-05-22 LAB — RAD ONC ARIA SESSION SUMMARY
Course Elapsed Days: 34
Plan Fractions Treated to Date: 25
Plan Prescribed Dose Per Fraction: 2.5 Gy
Plan Total Fractions Prescribed: 28
Plan Total Prescribed Dose: 70 Gy
Reference Point Dosage Given to Date: 62.5 Gy
Reference Point Session Dosage Given: 2.5 Gy
Session Number: 25

## 2023-05-23 ENCOUNTER — Other Ambulatory Visit: Payer: Self-pay

## 2023-05-23 ENCOUNTER — Ambulatory Visit
Admission: RE | Admit: 2023-05-23 | Discharge: 2023-05-23 | Disposition: A | Discharge status: Home / Self Care | Payer: Medicare HMO | Source: Ambulatory Visit | Attending: Radiation Oncology

## 2023-05-23 DIAGNOSIS — Z510 Encounter for antineoplastic radiation therapy: Secondary | ICD-10-CM | POA: Diagnosis not present

## 2023-05-23 DIAGNOSIS — Z191 Hormone sensitive malignancy status: Secondary | ICD-10-CM | POA: Diagnosis not present

## 2023-05-23 DIAGNOSIS — C61 Malignant neoplasm of prostate: Secondary | ICD-10-CM | POA: Diagnosis not present

## 2023-05-23 LAB — RAD ONC ARIA SESSION SUMMARY
Course Elapsed Days: 35
Plan Fractions Treated to Date: 26
Plan Prescribed Dose Per Fraction: 2.5 Gy
Plan Total Fractions Prescribed: 28
Plan Total Prescribed Dose: 70 Gy
Reference Point Dosage Given to Date: 65 Gy
Reference Point Session Dosage Given: 2.5 Gy
Session Number: 26

## 2023-05-24 ENCOUNTER — Other Ambulatory Visit: Payer: Self-pay

## 2023-05-24 ENCOUNTER — Ambulatory Visit
Admission: RE | Admit: 2023-05-24 | Discharge: 2023-05-24 | Disposition: A | Discharge status: Home / Self Care | Payer: Medicare HMO | Source: Ambulatory Visit | Attending: Radiation Oncology | Admitting: Radiation Oncology

## 2023-05-24 ENCOUNTER — Ambulatory Visit
Admission: RE | Admit: 2023-05-24 | Discharge: 2023-05-24 | Disposition: A | Discharge status: Home / Self Care | Payer: Medicare HMO | Source: Ambulatory Visit | Attending: Radiation Oncology

## 2023-05-24 DIAGNOSIS — C61 Malignant neoplasm of prostate: Secondary | ICD-10-CM | POA: Diagnosis not present

## 2023-05-24 DIAGNOSIS — Z510 Encounter for antineoplastic radiation therapy: Secondary | ICD-10-CM | POA: Diagnosis not present

## 2023-05-24 DIAGNOSIS — Z191 Hormone sensitive malignancy status: Secondary | ICD-10-CM | POA: Diagnosis not present

## 2023-05-24 LAB — RAD ONC ARIA SESSION SUMMARY
Course Elapsed Days: 36
Plan Fractions Treated to Date: 27
Plan Prescribed Dose Per Fraction: 2.5 Gy
Plan Total Fractions Prescribed: 28
Plan Total Prescribed Dose: 70 Gy
Reference Point Dosage Given to Date: 67.5 Gy
Reference Point Session Dosage Given: 2.5 Gy
Session Number: 27

## 2023-05-25 ENCOUNTER — Ambulatory Visit
Admission: RE | Admit: 2023-05-25 | Discharge: 2023-05-25 | Disposition: A | Discharge status: Home / Self Care | Payer: Medicare HMO | Source: Ambulatory Visit | Attending: Radiation Oncology | Admitting: Radiation Oncology

## 2023-05-25 ENCOUNTER — Other Ambulatory Visit: Payer: Self-pay

## 2023-05-25 DIAGNOSIS — Z191 Hormone sensitive malignancy status: Secondary | ICD-10-CM | POA: Diagnosis not present

## 2023-05-25 DIAGNOSIS — C61 Malignant neoplasm of prostate: Secondary | ICD-10-CM

## 2023-05-25 DIAGNOSIS — Z510 Encounter for antineoplastic radiation therapy: Secondary | ICD-10-CM | POA: Diagnosis not present

## 2023-05-25 LAB — RAD ONC ARIA SESSION SUMMARY
Course Elapsed Days: 37
Plan Fractions Treated to Date: 28
Plan Prescribed Dose Per Fraction: 2.5 Gy
Plan Total Fractions Prescribed: 28
Plan Total Prescribed Dose: 70 Gy
Reference Point Dosage Given to Date: 70 Gy
Reference Point Session Dosage Given: 2.5 Gy
Session Number: 28

## 2023-05-28 NOTE — Progress Notes (Signed)
 Patient was presented to the [facility] on 02/20/23 for his stage T1c adenocarcinoma of the prostate with a Gleason's score of 4+3 and a PSA of 9.01. Patient proceed with treatment recommendations of 5.5 weeks of daily radiation and had his final radiation treatment on 05/25/23.   Patient is scheduled for a post treatment nurse call on 4/1 and has his first post treatment PSA on 6/4 at [facility] followed by follow up with Dr. Laverle Patter on 6/11.     RN spoke with patient and provided education on post treatment PSA monitoring.  All questions answered, no additional needs at this time.

## 2023-05-28 NOTE — Radiation Completion Notes (Addendum)
  Radiation Oncology         (336) [phone] ________________________________  Name: Nicholas Hernandez MRN: [id number]  Date: 05/25/2023  DOB: 03-May-1945  Referring Physician: Crecencio Mc, M.D. Date of Service: 2023-05-28 Radiation Oncologist: Margaretmary Bayley, M.D. [facility]     RADIATION ONCOLOGY END OF TREATMENT NOTE     Diagnosis: 78 y.o. gentleman with stage T1c adenocarcinoma of the prostate with a Gleason's score of 4+3 and a PSA of 9.01.   Intent: Curative     ==========DELIVERED PLANS==========  First Treatment Date: 2023-04-18 Last Treatment Date: 2023-05-25   Plan Name: Prostate Site: Prostate Technique: IMRT Mode: Photon Dose Per Fraction: 2.5 Gy Prescribed Dose (Delivered / Prescribed): 70 Gy / 70 Gy Prescribed Fxs (Delivered / Prescribed): 28 / 28     ==========ON TREATMENT VISIT DATES========== 2023-04-20, 2023-04-27, 2023-05-04, 2023-05-11, 2023-05-18, 2023-05-24   See weekly On Treatment Notes in Epic for details in the Media tab (listed as Progress notes on the On Treatment Visit Dates listed above).  He tolerated the radiation treatments relatively well with only modest fatigue and mild LUTS that were managed with Flomax daily.  The patient will receive a call in about one month from the radiation oncology department. He will continue follow up with Dr. Marlou Porch as well.  ------------------------------------------------   Margaretmary Dys, MD [facility]  Radiation Oncology Direct Dial: [phone]  Fax: [phone] [email or web address]  Skype  LinkedIn

## 2023-06-05 ENCOUNTER — Telehealth (INDEPENDENT_AMBULATORY_CARE_PROVIDER_SITE_OTHER): Admitting: Adult Health

## 2023-06-05 ENCOUNTER — Telehealth: Payer: Self-pay | Admitting: Adult Health

## 2023-06-05 DIAGNOSIS — G4733 Obstructive sleep apnea (adult) (pediatric): Secondary | ICD-10-CM | POA: Diagnosis not present

## 2023-06-05 NOTE — Progress Notes (Signed)
 PATIENT: Nicholas Hernandez DOB: Oct 20, 1945  REASON FOR VISIT: follow up HISTORY FROM: patient   Virtual Visit via Video Note  I connected with Gita Kudo on 06/05/23 at  1:30 PM EDT by a video enabled telemedicine application located remotely at [facility] and verified that I am speaking with the correct person using two identifiers who was located at their own home in [place].   I discussed the limitations of evaluation and management by telemedicine and the availability of in person appointments. The patient expressed understanding and agreed to proceed.   PATIENT: Nicholas Hernandez DOB: 30-Sep-1945  REASON FOR VISIT: follow up HISTORY FROM: patient  HISTORY OF PRESENT ILLNESS: Today 06/05/23:  Nicholas Hernandez is a 78 y.o. male with a history of OSA on CPAP. Returns today for follow-up.  Reports that CPAP is working well for him.  Denies any new issues.  Download is below      06/13/22: Nicholas Hernandez is a 78 y.o. male with a history of obstructive sleep apnea on CPAP. Returns today for follow-up.  Reports that the CPAP continues to work well.  He changes at his  mask about every 3 months.  His download is below     06/07/21: Nicholas Hernandez is a 78 year old male with a history of obstructive sleep apnea on CPAP.  He returns today for follow-up.  He reports that the CPAP is working well.  He denies any new issues.  He returns today for an evaluation.    REVIEW OF SYSTEMS: Out of a complete 14 system review of symptoms, the patient complains only of the following symptoms, and all other reviewed systems are negative.  ALLERGIES: Allergies  Allergen Reactions   Bee Venom Swelling   Codeine Nausea And Vomiting    HOME MEDICATIONS: Outpatient Medications Prior to Visit  Medication Sig Dispense Refill   aspirin EC 81 MG tablet Take 81 mg by mouth daily. Swallow whole.     cetirizine (ZYRTEC) 5 MG chewable tablet Chew 5 mg by mouth daily.     EPINEPHrine 0.3  mg/0.3 mL IJ SOAJ injection SMARTSIG:Milliliter(s) IM     ezetimibe (ZETIA) 10 MG tablet Take 10 mg by mouth daily.     fluticasone (FLONASE) 50 MCG/ACT nasal spray HOLD RIGHT NOW     imiquimod (ALDARA) 5 % cream Apply to affected areas daily x 5-7 days. Repeat if needed.     ketoconazole (NIZORAL) 2 % shampoo SMARTSIG:Topical 2-3 Times Weekly     LIPITOR 40 MG tablet TAKE ONE TABLET BY MOUTH DAILY. 30 tablet 2   loratadine (CLARITIN) 10 MG tablet Take 10 mg by mouth daily.     losartan (COZAAR) 100 MG tablet Take 100 mg by mouth daily.     meloxicam (MOBIC) 15 MG tablet Take 15 mg by mouth as needed.     nitroGLYCERIN (NITROSTAT) 0.4 MG SL tablet Place 0.4 mg under the tongue every 5 (five) minutes as needed for chest pain (chest pain).     pantoprazole (PROTONIX) 40 MG tablet Take 40 mg by mouth as needed.     Psyllium (METAMUCIL PO) Take 1 Dose by mouth daily.     tamsulosin (FLOMAX) 0.4 MG CAPS capsule Take 1 capsule (0.4 mg total) by mouth daily. 30 capsule 3   ZINC SULFATE PO Take 100 mg by mouth daily.     zolpidem (AMBIEN) 10 MG tablet Take 10 mg by mouth at bedtime as needed.  No facility-administered medications prior to visit.    PAST MEDICAL HISTORY: Past Medical History:  Diagnosis Date   Allergic rhinitis    Arthritis    CAD (coronary artery disease)    post 3.5 x 15mm xience durg-eluting stent to distal right coronary artery   Chronic LBP    Elevated PSA    Esophageal stricture    status post dilatation 10-08-08   Essential hypertension, benign    External hemorrhoids    GERD (gastroesophageal reflux disease)    History of esophageal stricture    Hypercholesterolemia    Impaired fasting glucose    Shingles    Skin cancer    Sleep apnea     PAST SURGICAL HISTORY: Past Surgical History:  Procedure Laterality Date   APPENDECTOMY     CORONARY ANGIOPLASTY WITH STENT PLACEMENT  2010   95% blockage   GOLD SEED IMPLANT N/A 04/05/2023   Procedure: GOLD SEED  IMPLANT;  Surgeon: Crist Fat, MD;  Location: [facility];  Service: Urology;  Laterality: N/A;  30 MINUTES NEEDED FOR CASE   right inguinal herniorrhaphy     SPACE OAR INSTILLATION N/A 04/05/2023   Procedure: SPACE OAR INSTILLATION;  Surgeon: Crist Fat, MD;  Location: [facility];  Service: Urology;  Laterality: N/A;   TONSILLECTOMY      FAMILY HISTORY: Family History  Problem Relation Age of Onset   Alzheimer's disease Mother    Heart attack Father        MI at 78, 26   Hypertension Father    Diabetes Mellitus II Father    Healthy Sister    Heart attack Maternal Grandfather    Heart attack Paternal Environmental education officer Daughter    Healthy Daughter     SOCIAL HISTORY: Social History   Socioeconomic History   Marital status: Married    Spouse name: Not on file   Number of children: Not on file   Years of education: Not on file   Highest education level: Not on file  Occupational History   Not on file  Tobacco Use   Smoking status: Former   Smokeless tobacco: Never  Vaping Use   Vaping status: Never Used  Substance and Sexual Activity   Alcohol use: Yes    Comment: occas; 2 beers in evenings   Drug use: No   Sexual activity: Not on file  Other Topics Concern   Not on file  Social History Narrative   Not on file   Social Drivers of Health   Financial Resource Strain: Not on file  Food Insecurity: No Food Insecurity (02/20/2023)   Hunger Vital Sign    Worried About Running Out of Food in the Last Year: Never true    Ran Out of Food in the Last Year: Never true  Transportation Needs: No Transportation Needs (02/20/2023)   PRAPARE - Administrator, Civil Service (Medical): No    Lack of Transportation (Non-Medical): No  Physical Activity: Not on file  Stress: Not on file  Social Connections: Not on file  Intimate Partner Violence: Not At Risk (02/20/2023)   Humiliation, Afraid, Rape, and Kick  questionnaire    Fear of Current or Ex-Partner: No    Emotionally Abused: No    Physically Abused: No    Sexually Abused: No      PHYSICAL EXAM Generalized: Well developed, in no acute distress   Neurological examination  Mentation: Alert oriented to time,  place, history taking. Follows all commands speech and language fluent Cranial nerve II-XII: Facial symmetry noted  DIAGNOSTIC DATA (LABS, IMAGING, TESTING) - I reviewed patient records, labs, notes, testing and imaging myself where available.  Lab Results  Component Value Date   WBC 9.4 04/12/2017   HGB 17.0 04/05/2023   HCT 50.0 04/05/2023   MCV 93.5 04/12/2017   PLT 231 04/12/2017      Component Value Date/Time   NA 142 04/05/2023 0615   NA 141 02/27/2017 1222   K 4.2 04/05/2023 0615   K 4.0 02/27/2017 1222   CL 105 04/05/2023 0615   CO2 26 02/27/2017 1222   GLUCOSE 109 (H) 04/05/2023 0615   GLUCOSE 109 02/27/2017 1222   BUN 23 04/05/2023 0615   BUN 14.5 02/27/2017 1222   CREATININE 1.40 (H) 04/05/2023 0615   CREATININE 1.1 02/27/2017 1222   CALCIUM 10.0 02/27/2017 1222   PROT 8.0 02/27/2017 1222   ALBUMIN 4.4 02/27/2017 1222   AST 27 02/27/2017 1222   ALT 23 02/27/2017 1222   ALKPHOS 137 02/27/2017 1222   BILITOT 1.83 (H) 02/27/2017 1222   GFRNONAA >60 10/10/2008 0635   GFRAA  10/10/2008 0635    >60        The eGFR has been calculated using the MDRD equation. This calculation has not been validated in all clinical situations. eGFR's persistently <60 mL/min signify possible Chronic Kidney Disease.   Lab Results  Component Value Date   CHOL 176 01/19/2010   HDL 67.20 01/19/2010   LDLCALC 96 01/19/2010   TRIG 63.0 01/19/2010   CHOLHDL 3 01/19/2010       ASSESSMENT AND PLAN 78 y.o. year old male  has a past medical history of Allergic rhinitis, Arthritis, CAD (coronary artery disease), Chronic LBP, Elevated PSA, Esophageal stricture, Essential hypertension, benign, External hemorrhoids, GERD  (gastroesophageal reflux disease), History of esophageal stricture, Hypercholesterolemia, Impaired fasting glucose, Shingles, Skin cancer, and Sleep apnea. here with:  OSA on CPAP  CPAP compliance excellent Residual AHI is good Encouraged patient to continue using CPAP nightly and > 4 hours each night F/U in 1 year or sooner if needed    Butch Penny, MSN, NP-C 06/05/2023, 1:28 PM [facility] [address] [place], [place] [address] [phone]

## 2023-06-05 NOTE — Telephone Encounter (Signed)
 Pt called to reschedule appointment due to will be traveling and unable to do MyChart Video Visit.

## 2023-06-18 NOTE — Progress Notes (Addendum)
  Radiation Oncology         [phone]) [phone] ________________________________  Name: Nicholas Hernandez MRN: [id number]  Date of Service: 06/18/2023  DOB: Mar 12, 1946  Post Treatment Telephone Note  Diagnosis:  C61 Malignant neoplasm of prostate (as documented in provider EOT note)  Pre Treatment IPSS Score: 10 (as documented in the provider consult note)  The patient was available for call today.   Symptoms of fatigue have improved since completing therapy.  Symptoms of bladder changes have improved since completing therapy. Current symptoms include moderately weak, and medications for bladder symptoms include Tamsulosin.  Symptoms of bowel changes have improved since completing therapy. Current symptoms include none, and medications for bowel symptoms include none.   Post Treatment IPSS Score: IPSS Questionnaire (AUA-7): Over the past month.   1)  How often have you had a sensation of not emptying your bladder completely after you finish urinating?  1 - Less than 1 time in 5  2)  How often have you had to urinate again less than two hours after you finished urinating? 3 - About half the time  3)  How often have you found you stopped and started again several times when you urinated?  1 - Less than 1 time in 5  4) How difficult have you found it to postpone urination?  2 - Less than half the time  5) How often have you had a weak urinary stream?  1 - Less than 1 time in 5  6) How often have you had to push or strain to begin urination?  0 - Not at all  7) How many times did you most typically get up to urinate from the time you went to bed until the time you got up in the morning?  2 - 2 times  Total score:  10. Which indicates moderate symptoms  0-7 mildly symptomatic   8-19 moderately symptomatic   20-35 severely symptomatic    Patient has a scheduled follow up visit with his urologist, Dr. Laverle Patter, on 09/05/2023 for ongoing surveillance. He was counseled that PSA levels will be drawn  in the urology office, and was reassured that additional time is expected to improve bowel and bladder symptoms. He was encouraged to call back with concerns or questions regarding radiation.   This concludes the interaction.  Ruel Favors, LPN

## 2023-06-19 ENCOUNTER — Telehealth: Payer: Medicare Other | Admitting: Adult Health

## 2023-06-26 ENCOUNTER — Ambulatory Visit
Admission: RE | Admit: 2023-06-26 | Discharge: 2023-06-26 | Disposition: A | Discharge status: Home / Self Care | Payer: Medicare HMO | Source: Ambulatory Visit | Attending: Internal Medicine | Admitting: Internal Medicine

## 2023-07-02 ENCOUNTER — Ambulatory Visit (INDEPENDENT_AMBULATORY_CARE_PROVIDER_SITE_OTHER)

## 2023-07-02 ENCOUNTER — Encounter: Payer: Self-pay | Admitting: Podiatrist

## 2023-07-02 ENCOUNTER — Ambulatory Visit: Admitting: Podiatrist

## 2023-07-02 VITALS — Ht 71.0 in | Wt 179.0 lb

## 2023-07-02 DIAGNOSIS — M79674 Pain in right toe(s): Secondary | ICD-10-CM

## 2023-07-02 DIAGNOSIS — M205X1 Other deformities of toe(s) (acquired), right foot: Secondary | ICD-10-CM | POA: Diagnosis not present

## 2023-07-02 DIAGNOSIS — L03031 Cellulitis of right toe: Secondary | ICD-10-CM

## 2023-07-02 MED ORDER — DOXYCYCLINE HYCLATE 100 MG PO TABS: 100.0000 mg | ORAL_TABLET | Freq: Two times a day (BID) | ORAL | 0 refills | Status: DC

## 2023-07-02 NOTE — Patient Instructions (Signed)
Hallux Rigidus  Hallux rigidus is a type of joint condition (degenerativearthritis) that affects your big toe (hallux). It involves the joint that connects the base of your big toe to the main part of your foot (metatarsophalangeal or MTP joint). Hallux rigidus can cause your big toe to become stiff, painful, and hard to move. Symptoms may get worse over time. They may also get worse with movement or in cold or damp weather. What are the causes? You may get hallux rigidus if your foot does not work the way it should or is not shaped the way it should be. These foot problems can run in families. They may be passed down from parents to children. Hallux rigidus can also be caused by: Injury. Overuse. What increases the risk? You are more likely to get hallux rigidus if you have: A foot bone (metatarsal) that is longer or higher than it should be. A family history of the condition. Injured your big toe in the past. Feet that do not have a curve (arch) on the inner side of the foot. These may be called flat feet or fallen arches. Ankles that turn in when you walk (pronation). Rheumatoid arthritis or gout. A job where you often have to stoop down. What are the signs or symptoms? Symptoms of hallux rigidus include: Pain in your big toe. Stiffness and trouble moving your big toe. Swelling of the toe and the area around it. Bone spurs. These are bony growths that can form on the joint of the big toe. A limp. How is this diagnosed? Hallux rigidus is diagnosed based on your medical history and a physical exam. You may also have X-rays taken. How is this treated? To treat hallux rigidus, you may need to: Wear roomy, comfortable shoes that have a large toe box. Put inserts (orthotics) in your shoes to support your foot. Take medicine. Do physical therapy. Ice the area. Switch between putting your foot in cold water and then warm water. If your condition is severe, treatment may  include: Corticosteroid injections. These can help relieve pain. Surgery. This may be done to remove bone spurs, fuse bones together, or replace the whole joint. Follow these instructions at home: Managing pain, stiffness, and swelling  Put your foot in cold water for 30 seconds, and then in warm water for 30 seconds. Switch between the cold and warm water for 5 minutes. Do this a few times a day or as told by your health care provider. If told, put ice on the injured area. Put ice in a plastic bag. Place a towel between your skin and the bag. Leave the ice on for 20 minutes, 2-3 times a day. If your skin turns bright red, remove the ice right away to prevent skin damage. The risk of damage is higher if you cannot feel pain, heat, or cold. General instructions Take over-the-counter and prescription medicines only as told by your provider. Do not wear high heels or other shoes that restrict your feet. Wear shoes that are comfortable and that provide support. Wear orthotics as told by your provider. Do foot exercises as told by your provider or physical therapist. Contact a health care provider if: You see bone spurs or growths on or around your big toe. Your pain does not get better or it gets worse. You have pain while resting. You have pain in other parts of your body. These may include your back, hip, or knee. You start to limp. This information is not intended to  replace advice given to you by your health care provider. Make sure you discuss any questions you have with your health care provider. Document Revised: 03/28/2022 Document Reviewed: 03/28/2022 Elsevier Patient Education  2024 [employer].

## 2023-07-02 NOTE — Progress Notes (Signed)
 Chief Complaint  Patient presents with   Nail Problem    " I have had a problem with my nails and in the past I Was on the oral pill for the nail fungus with both big toes and it got better and recently I was seeing dermatology and was given a topical soliton to place on my toes and I have been soaking the foot in epsom salt " I have also been having some pain in my right toe as well"     HPI: Patient is 78 y.o. male who presents today for for evaluation of the right hallux nail-  he relates he has been having pain in the right great toe joint as well.  He relates the hallux nail was loose and red-  he has trimmed the nail back, and has been soaking in epsom salt soaks.  He relates improvement in the redness and discomfort in the right hallux nail.    Patient Active Problem List   Diagnosis Date Noted   Primary prostate adenocarcinoma (HCC) 02/19/2023   Hip impingement syndrome, right 08/24/2020   Erythrocytosis 03/31/2017   Impaired fasting glucose    Chronic low back pain    Shingles    Allergic rhinitis    History of esophageal stricture    CAD, NATIVE VESSEL 11/15/2008   ESOPHAGEAL STRICTURE 10/28/2008   GERD 10/28/2008   HYPERLIPIDEMIA 10/08/2008   ESSENTIAL HYPERTENSION, BENIGN 10/08/2008   External hemorrhoids 10/08/2008   MYOCARDIAL PERFUSION SCAN, WITH STRESS TEST, ABNORMAL 10/08/2008    Current Outpatient Medications on File Prior to Visit  Medication Sig Dispense Refill   aspirin EC 81 MG tablet Take 81 mg by mouth daily. Swallow whole.     cetirizine (ZYRTEC) 5 MG chewable tablet Chew 5 mg by mouth daily.     EPINEPHrine 0.3 mg/0.3 mL IJ SOAJ injection SMARTSIG:Milliliter(s) IM     ezetimibe (ZETIA) 10 MG tablet Take 10 mg by mouth daily.     fluticasone (FLONASE) 50 MCG/ACT nasal spray HOLD RIGHT NOW     imiquimod (ALDARA) 5 % cream Apply to affected areas daily x 5-7 days. Repeat if needed.     ketoconazole (NIZORAL) 2 % shampoo SMARTSIG:Topical 2-3 Times Weekly      LIPITOR 40 MG tablet TAKE ONE TABLET BY MOUTH DAILY. 30 tablet 2   loratadine (CLARITIN) 10 MG tablet Take 10 mg by mouth daily.     losartan (COZAAR) 100 MG tablet Take 100 mg by mouth daily.     meloxicam (MOBIC) 15 MG tablet Take 15 mg by mouth as needed.     nitroGLYCERIN (NITROSTAT) 0.4 MG SL tablet Place 0.4 mg under the tongue every 5 (five) minutes as needed for chest pain (chest pain).     pantoprazole (PROTONIX) 40 MG tablet Take 40 mg by mouth as needed.     Psyllium (METAMUCIL PO) Take 1 Dose by mouth daily.     tamsulosin (FLOMAX) 0.4 MG CAPS capsule Take 1 capsule (0.4 mg total) by mouth daily. 30 capsule 3   ZINC SULFATE PO Take 100 mg by mouth daily.     zolpidem (AMBIEN) 10 MG tablet Take 10 mg by mouth at bedtime as needed.     No current facility-administered medications on file prior to visit.    Allergies  Allergen Reactions   Bee Venom Swelling   Codeine Nausea And Vomiting    Review of Systems No fevers, chills, nausea, muscle aches, no difficulty breathing, no calf pain, no  chest pain or shortness of breath.   Physical Exam  GENERAL APPEARANCE: Alert, conversant. Appropriately groomed. No acute distress.   VASCULAR: Pedal pulses palpable 2/4 DP and 2/4 PT bilateral.  Capillary refill time is immediate to all digits,  Proximal to distal cooling is warm to warm.  Digital perfusion adequate.   NEUROLOGIC: sensation is intact to 5.07 monofilament at 5/5 sites bilateral.  Light touch is intact bilateral, vibratory sensation intact bilateral  MUSCULOSKELETAL: acceptable muscle strength, tone and stability bilateral.  Enlarged first metatarsal phalangeal joint of the right with decrease range of motion and crepitus within the joint itself.    DERMATOLOGIC: skin is warm, supple, and dry.  Color, texture, and turgor of skin within normal limits.  No open wounds are noted.  Bilateral hallux nails are thick, discolored, crumbly and appear mycotic.  The right hallux  nail appears to have been loose at the distal portion, but Celene Kras was able to trim it back to a position where it is attached to the underlying nail bed.  Some redness still present along the proximal nail fold consistent with resolving skin infection proximal to the nail fold.    Xray-  3 views of the right foot are obtained.  Normal osseous mineralization noted-  decrease joint space with arthritic changes at the first metatarsal phalangeal joint noted.  Mild dorsal spur at the first metatarsal head is also noted.    Assessment:     ICD-10-CM   1. Great toe pain, right  M79.674 DG Foot Complete Right    2. Hallux limitus, acquired, right  M20.5X1     3. Paronychia of great toe, right  L03.031          Plan  Right hallux appears to be improving.  Recommended extending the doxyxycline for 5 more days.  Also recommended continued soaks for that same amount of time.  Discussed we could try an injection for the right first mpj pain.  He would like to try this today.  Injection carried out as stated below.  He will call if any concerns arise and will be seen prn.  Procedure: Injection right foot Discussed alternatives, risks, complications and verbal consent was obtained.  Location: dorsal first metatarsal phalangeal joint  Skin Prep: Alcohol. Injectate: 1cc 0.5% marcaine plain, 1 cc 10 mg Kenalog Disposition: Patient tolerated procedure well. Injection site dressed with a band-aid.  Post-injection care was discussed and return precautions discussed.

## 2023-07-06 ENCOUNTER — Other Ambulatory Visit: Payer: Self-pay | Admitting: Urology

## 2023-07-06 DIAGNOSIS — C61 Malignant neoplasm of prostate: Secondary | ICD-10-CM

## 2023-07-12 DIAGNOSIS — L820 Inflamed seborrheic keratosis: Secondary | ICD-10-CM | POA: Diagnosis not present

## 2023-07-12 DIAGNOSIS — L578 Other skin changes due to chronic exposure to nonionizing radiation: Secondary | ICD-10-CM | POA: Diagnosis not present

## 2023-07-12 DIAGNOSIS — Z85828 Personal history of other malignant neoplasm of skin: Secondary | ICD-10-CM | POA: Diagnosis not present

## 2023-07-12 DIAGNOSIS — L570 Actinic keratosis: Secondary | ICD-10-CM | POA: Diagnosis not present

## 2023-07-12 DIAGNOSIS — L219 Seborrheic dermatitis, unspecified: Secondary | ICD-10-CM | POA: Diagnosis not present

## 2023-07-19 ENCOUNTER — Encounter: Payer: Self-pay | Admitting: *Deleted

## 2023-07-24 DIAGNOSIS — R269 Unspecified abnormalities of gait and mobility: Secondary | ICD-10-CM | POA: Diagnosis not present

## 2023-07-24 DIAGNOSIS — M79671 Pain in right foot: Secondary | ICD-10-CM | POA: Diagnosis not present

## 2023-08-14 ENCOUNTER — Encounter: Payer: Self-pay | Admitting: *Deleted

## 2023-08-14 ENCOUNTER — Inpatient Hospital Stay: Attending: Adult Health | Admitting: *Deleted

## 2023-08-14 DIAGNOSIS — C61 Malignant neoplasm of prostate: Secondary | ICD-10-CM

## 2023-08-14 NOTE — Progress Notes (Signed)
 SCP reviewed and completed. Allergies and meds updated. Pt denies smoking and drinking. Vaccines updated.Last cologuard was 2022. Negative results. Pt does walk 12,000-15,000 steps daily and plays golf twice weekly A copy of prostate cancer summary has been faxed to PCP.Nicholas Hernandez

## 2023-08-31 DIAGNOSIS — R42 Dizziness and giddiness: Secondary | ICD-10-CM | POA: Diagnosis not present

## 2023-08-31 DIAGNOSIS — I2510 Atherosclerotic heart disease of native coronary artery without angina pectoris: Secondary | ICD-10-CM | POA: Diagnosis not present

## 2023-08-31 DIAGNOSIS — I10 Essential (primary) hypertension: Secondary | ICD-10-CM | POA: Diagnosis not present

## 2023-08-31 DIAGNOSIS — C61 Malignant neoplasm of prostate: Secondary | ICD-10-CM | POA: Diagnosis not present

## 2023-12-06 DIAGNOSIS — H52223 Regular astigmatism, bilateral: Secondary | ICD-10-CM | POA: Diagnosis not present

## 2024-01-31 DIAGNOSIS — L570 Actinic keratosis: Secondary | ICD-10-CM | POA: Diagnosis not present

## 2024-01-31 DIAGNOSIS — L989 Disorder of the skin and subcutaneous tissue, unspecified: Secondary | ICD-10-CM | POA: Diagnosis not present

## 2024-02-19 ENCOUNTER — Ambulatory Visit: Admitting: Cardiovascular Disease

## 2024-04-01 NOTE — Progress Notes (Unsigned)
 " Cardiology Office Note:    Date:  04/02/2024   ID:  Nicholas Hernandez, DOB Aug 07, 1945, MRN [id number]  PCP:  Nicholas Hernandez., MD   [facility] Providers Cardiologist:  Ozell Fell, MD     Referring MD: Nicholas Hernandez., MD   Chief Complaint  Patient presents with   Coronary Artery Disease    History of Present Illness:    Nicholas Hernandez is a 79 y.o. male presenting for follow-up of coronary artery disease.  The patient was initially diagnosed with CAD in 2010 when he presented with unstable angina.  He underwent PCI of the right coronary artery with a drug-eluting stent at that time.  He has not had recurrent angina or ischemic symptoms.  His last office visit here was in April 2022.  2-year follow-up was recommended as he follows closely with his primary care physician, Dr. Loreli.   The patient is here alone today. This summer, he had episodes of dizziness while he was walking the golf course. He generally walks 3-4 days per week when he plays golf. He started drinking more water and has been feeling better. In addition, his antihypertensives were reduced by 50% with reduction of losartan from 100 mg down to 50 mg daily. He was treated for prostate cancer last year with radiation therapy.    Current Medications: Active Medications[1]   Allergies:   Bee venom and Codeine   ROS:   Please see the history of present illness.    All other systems reviewed and are negative.  EKGs/Labs/Other Studies Reviewed:    The following studies were reviewed today: Cardiac Studies & Procedures   ______________________________________________________________________________________________   STRESS TESTS  EXERCISE TOLERANCE TEST (ETT) 02/22/2015  Interpretation Summary  Upsloping ST segment depression ST segment depression was noted during stress, and returning to baseline after less than 1 minute of recovery.   ECHOCARDIOGRAM  ECHOCARDIOGRAM COMPLETE  05/07/2017  Narrative *[facility]* [address] [place], [place] [address] [phone]  ------------------------------------------------------------------- Transthoracic Echocardiography  Patient:    Nicholas, Hernandez MR #:       [id number] Study Date: 05/07/2017 Gender:     M Age:        71 Height:     182.9 cm Weight:     87.2 kg BSA:        2.11 m^2 Pt. Status: Room:  TISA Nicholas Elsie BARTON Nicholas Elsie ATTENDING    Oneil Parchment, M.D. SONOGRAPHER  Waldo Guadalajara, RCS PERFORMING   [facility], Outpatient  cc:  ------------------------------------------------------------------- LV EF: 65% -   70%  ------------------------------------------------------------------- Indications:      DOE (R06.09).  ------------------------------------------------------------------- History:   PMH:  Hyperlipidemia.  Coronary artery disease.  ------------------------------------------------------------------- Study Conclusions  - Left ventricle: The cavity size was normal. There was mild concentric hypertrophy. Systolic function was vigorous. The estimated ejection fraction was in the range of 65% to 70%. Wall motion was normal; there were no regional wall motion abnormalities. Doppler parameters are consistent with abnormal left ventricular relaxation (grade 1 diastolic dysfunction). There was no evidence of elevated ventricular filling pressure by Doppler parameters. - Aortic valve: There was no regurgitation. - Aortic root: The aortic root was normal in size. - Mitral valve: Calcified annulus. Mildly thickened leaflets with redundant chardae. There was trivial regurgitation. - Left atrium: The atrium was normal in size. - Right ventricle: The cavity size was normal. Wall thickness was normal.  Systolic function was normal. - Right atrium: The atrium was normal in size. - Tricuspid valve: There was trivial regurgitation. - Pulmonary arteries: Systolic pressure  was within the normal range. - Inferior vena cava: The vessel was normal in size. The respirophasic diameter changes were in the normal range (= 50%), consistent with normal central venous pressure. - Pericardium, extracardiac: There was no pericardial effusion.  ------------------------------------------------------------------- Study data:  No prior study was available for comparison.  Study status:  Routine.  Procedure:  The patient reported no pain pre or post test. Transthoracic echocardiography. Image quality was adequate.          Transthoracic echocardiography. M-mode, complete 2D,3D, spectral Doppler, and color Doppler.  Birthdate:  Patient birthdate: Dec 09, 1945.  Age:  Patient is 79 yr old.  Sex:  Gender: male.    BMI: 26.1 kg/m^2.  Blood pressure:     143/82  Patient status:  Outpatient.  Study date:  Study date: 05/07/2017. Study time: 07:36 AM.  Location:  [facility]  -------------------------------------------------------------------  ------------------------------------------------------------------- Left ventricle:  The cavity size was normal. There was mild concentric hypertrophy. Systolic function was vigorous. The estimated ejection fraction was in the range of 65% to 70%. Wall motion was normal; there were no regional wall motion abnormalities. Doppler parameters are consistent with abnormal left ventricular relaxation (grade 1 diastolic dysfunction). There was no evidence of elevated ventricular filling pressure by Doppler parameters.  ------------------------------------------------------------------- Aortic valve:   Trileaflet; mildly thickened, mildly calcified leaflets. Mobility was not restricted.  Doppler:  Transvalvular velocity was within the normal range. There was no stenosis. There was no regurgitation.  ------------------------------------------------------------------- Aorta:  Aortic root: The aortic root was normal in  size.  ------------------------------------------------------------------- Mitral valve:  Calcified annulus. Mildly thickened leaflets with redundant chardae. Mobility was not restricted.  Doppler: Transvalvular velocity was within the normal range. There was no evidence for stenosis. There was trivial regurgitation.  ------------------------------------------------------------------- Left atrium:  The atrium was normal in size.  ------------------------------------------------------------------- Right ventricle:  The cavity size was normal. Wall thickness was normal. Systolic function was normal.  ------------------------------------------------------------------- Pulmonic valve:    Doppler:  Transvalvular velocity was within the normal range. There was no evidence for stenosis.  ------------------------------------------------------------------- Tricuspid valve:   Structurally normal valve.    Doppler: Transvalvular velocity was within the normal range. There was trivial regurgitation.  ------------------------------------------------------------------- Pulmonary artery:   The main pulmonary artery was normal-sized. Systolic pressure was within the normal range.  ------------------------------------------------------------------- Right atrium:  The atrium was normal in size.  ------------------------------------------------------------------- Pericardium:  There was no pericardial effusion.  ------------------------------------------------------------------- Systemic veins: Inferior vena cava: The vessel was normal in size. The respirophasic diameter changes were in the normal range (= 50%), consistent with normal central venous pressure. Diameter: 14 mm.  ------------------------------------------------------------------- Measurements  IVC                                        Value        Reference ID                                         14    mm      ----------  Left ventricle  Value        Reference LV ID, ED, PLAX chordal                    43.9  mm     43 - 52 LV ID, ES, PLAX chordal                    27.7  mm     23 - 38 LV fx shortening, PLAX chordal             37    %      >=29 LV PW thickness, ED                        12    mm     ---------- IVS/LV PW ratio, ED                        0.92         <=1.3 Stroke volume, 2D                          82    ml     ---------- Stroke volume/bsa, 2D                      39    ml/m^2 ---------- LV e&', lateral                             6.31  cm/s   ---------- LV E/e&', lateral                           10.76        ---------- LV e&', medial                              6.53  cm/s   ---------- LV E/e&', medial                            10.4         ---------- LV e&', average                             6.42  cm/s   ---------- LV E/e&', average                           10.58        ----------  Ventricular septum                         Value        Reference IVS thickness, ED                          11    mm     ----------  LVOT                                       Value        Reference LVOT ID,  S                                 20    mm     ---------- LVOT area                                  3.14  cm^2   ---------- LVOT peak velocity, S                      112   cm/s   ---------- LVOT mean velocity, S                      72.4  cm/s   ---------- LVOT VTI, S                                26.2  cm     ---------- LVOT peak gradient, S                      5     mm Hg  ----------  Aorta                                      Value        Reference Aortic root ID, ED                         35    mm     ----------  Left atrium                                Value        Reference LA ID, A-P, ES                             36    mm     ---------- LA ID/bsa, A-P                             1.7   cm/m^2 <=2.2 LA volume, S                                45.9  ml     ---------- LA volume/bsa, S                           21.7  ml/m^2 ---------- LA volume, ES, 1-p A4C                     39.7  ml     ---------- LA volume/bsa, ES, 1-p A4C                 18.8  ml/m^2 ---------- LA volume, ES, 1-p A2C                     51.9  ml     ---------- LA volume/bsa, ES, 1-p  A2C                 24.5  ml/m^2 ----------  Mitral valve                               Value        Reference Mitral E-wave peak velocity                67.9  cm/s   ---------- Mitral A-wave peak velocity                82.5  cm/s   ---------- Mitral deceleration time           (H)     366   ms     150 - 230 Mitral E/A ratio, peak                     0.8          ----------  Pulmonary arteries                         Value        Reference PA pressure, S, DP                         14    mm Hg  <=30  Tricuspid valve                            Value        Reference Tricuspid regurg peak velocity             167   cm/s   ---------- Tricuspid peak RV-RA gradient              11    mm Hg  ---------- Tricuspid maximal regurg velocity,         167   cm/s   ---------- PISA  Right atrium                               Value        Reference RA ID, S-I, ES, A4C                (H)     51.3  mm     34 - 49 RA area, ES, A4C                           14.7  cm^2   8.3 - 19.5 RA volume, ES, A/L                         34.7  ml     ---------- RA volume/bsa, ES, A/L                     16.4  ml/m^2 ----------  Systemic veins                             Value        Reference Estimated CVP  3     mm Hg  ----------  Right ventricle                            Value        Reference TAPSE                                      19.3  mm     ---------- RV pressure, S, DP                         14    mm Hg  <=30 RV s&', lateral, S                          11    cm/s   ----------  Legend: (L)  and  (H)  mark values outside specified reference  range.  ------------------------------------------------------------------- Prepared and Electronically Authenticated by  Leim Moose, M.D. 2019-02-11T11:38:38          ______________________________________________________________________________________________      EKG:   EKG Interpretation Date/Time:  Wednesday April 02 2024 09:12:43 EST Ventricular Rate:  67 PR Interval:  170 QRS Duration:  92 QT Interval:  402 QTC Calculation: 424 R Axis:   -11  Text Interpretation: Sinus rhythm with occasional Premature ventricular complexes When compared with ECG of 10-Oct-2008 05:10, Premature ventricular complexes are now Present Criteria for Septal infarct are no longer Present Confirmed by Wonda Sharper [phone]) on 04/02/2024 9:25:41 AM    Recent Labs: 04/05/2023: BUN 23; Creatinine, Ser 1.40; Hemoglobin 17.0; Potassium 4.2; Sodium 142  Recent Lipid Panel    Component Value Date/Time   CHOL 176 01/19/2010 0924   TRIG 63.0 01/19/2010 0924   HDL 67.20 01/19/2010 0924   CHOLHDL 3 01/19/2010 0924   VLDL 12.6 01/19/2010 0924   LDLCALC 96 01/19/2010 0924     Risk Assessment/Calculations:                Physical Exam:    VS:  BP 124/64   Pulse 67   Ht 5' 11 (1.803 m)   Wt 186 lb (84.4 kg)   SpO2 98%   BMI 25.94 kg/m     Wt Readings from Last 3 Encounters:  04/02/24 186 lb (84.4 kg)  07/02/23 179 lb (81.2 kg)  04/05/23 179 lb 8 oz (81.4 kg)     GEN:  Well nourished, well developed in no acute distress HEENT: Normal NECK: No JVD; No carotid bruits LYMPHATICS: No lymphadenopathy CARDIAC: RRR, no murmurs, rubs, gallops RESPIRATORY:  Clear to auscultation without rales, wheezing or rhonchi  ABDOMEN: Soft, non-tender, non-distended MUSCULOSKELETAL:  No edema; No deformity  SKIN: Warm and dry NEUROLOGIC:  Alert and oriented x 3 PSYCHIATRIC:  Normal affect   Assessment & Plan Coronary artery disease involving native coronary artery of native heart without  angina pectoris Continue aspirin, ezetimibe, and atorvastatin at current doses.  No anginal symptoms.  Follow-up 1 year. Mixed hyperlipidemia Lipids are excellent with an LDL of 42, ALT is 21.  Continue current management with ezetimibe and atorvastatin. Essential hypertension Patient has had some episodes of low blood pressure.  His losartan has been reduced from 100 down to 50 mg daily.  Encouraged him to continue to hydrate well.  Could reduce losartan further if needed.  Blood pressure in good range today.  Continue current management.            Medication Adjustments/Labs and Tests Ordered: Current medicines are reviewed at length with the patient today.  Concerns regarding medicines are outlined above.  Orders Placed This Encounter  Procedures   EKG 12-Lead   No orders of the defined types were placed in this encounter.   Patient Instructions  Medication Instructions:  Your physician recommends that you continue on your current medications as directed. Please refer to the Current Medication list given to you today.   *If you need a refill on your cardiac medications before your next appointment, please call your pharmacy*  Lab Work: NONE    Testing/Procedures: NONE   Follow-Up: At [facility], you and your health needs are our priority.  As part of our continuing mission to provide you with exceptional heart care, our providers are all part of one team.  This team includes your primary Cardiologist (physician) and Advanced Practice Providers or APPs (Physician Assistants and Nurse Practitioners) who all work together to provide you with the care you need, when you need it.  Your next appointment:   1 year(s)  Provider:   Ozell Fell, MD       Signed, Ozell Fell, MD  04/02/2024 5:51 PM    [facility]     [1]  Current Meds  Medication Sig   aspirin EC 81 MG tablet Take 81 mg by mouth daily. Swallow whole.   cetirizine (ZYRTEC) 5  MG chewable tablet Chew 5 mg by mouth daily.   EPINEPHrine 0.3 mg/0.3 mL IJ SOAJ injection SMARTSIG:Milliliter(s) IM   ezetimibe (ZETIA) 10 MG tablet Take 10 mg by mouth daily.   fluticasone (FLONASE) 50 MCG/ACT nasal spray Place 1 spray into both nostrils daily.   ketoconazole (NIZORAL) 2 % shampoo SMARTSIG:Topical 2-3 Times Weekly   LIPITOR 40 MG tablet TAKE ONE TABLET BY MOUTH DAILY.   loratadine (CLARITIN) 10 MG tablet Take 10 mg by mouth daily.   losartan (COZAAR) 50 MG tablet 1 tablet Orally Once a day; Duration: 30 day(s)   meloxicam (MOBIC) 15 MG tablet Take 15 mg by mouth as needed.   nitroGLYCERIN  (NITROSTAT ) 0.4 MG SL tablet Place 0.4 mg under the tongue every 5 (five) minutes as needed for chest pain (chest pain).   pantoprazole (PROTONIX) 40 MG tablet Take 40 mg by mouth as needed.   Psyllium (METAMUCIL PO) Take 1 Dose by mouth daily.   ZINC SULFATE PO Take 100 mg by mouth daily.   zolpidem (AMBIEN) 10 MG tablet Take 10 mg by mouth at bedtime as needed for sleep.   "

## 2024-04-02 ENCOUNTER — Ambulatory Visit: Admitting: Cardiovascular Disease

## 2024-04-02 ENCOUNTER — Encounter: Payer: Self-pay | Admitting: Cardiovascular Disease

## 2024-04-02 VITALS — BP 124/64 | HR 67 | Ht 71.0 in | Wt 186.0 lb

## 2024-04-02 DIAGNOSIS — I2510 Atherosclerotic heart disease of native coronary artery without angina pectoris: Secondary | ICD-10-CM | POA: Diagnosis not present

## 2024-04-02 DIAGNOSIS — E782 Mixed hyperlipidemia: Secondary | ICD-10-CM | POA: Diagnosis not present

## 2024-04-02 DIAGNOSIS — I10 Essential (primary) hypertension: Secondary | ICD-10-CM

## 2024-04-02 NOTE — Patient Instructions (Signed)
 Medication Instructions:  Your physician recommends that you continue on your current medications as directed. Please refer to the Current Medication list given to you today.   *If you need a refill on your cardiac medications before your next appointment, please call your pharmacy*  Lab Work: NONE    Testing/Procedures: NONE   Follow-Up: At [facility], you and your health needs are our priority.  As part of our continuing mission to provide you with exceptional heart care, our providers are all part of one team.  This team includes your primary Cardiologist (physician) and Advanced Practice Providers or APPs (Physician Assistants and Nurse Practitioners) who all work together to provide you with the care you need, when you need it.  Your next appointment:   1 year(s)  Provider:   Ozell Fell, MD

## 2024-04-10 ENCOUNTER — Telehealth: Payer: Self-pay | Admitting: Adult Health

## 2024-04-10 NOTE — Telephone Encounter (Signed)
 Patient returned phone call to reschedule appointment due to provider.

## 2024-05-30 ENCOUNTER — Ambulatory Visit: Admitting: Cardiovascular Disease

## 2024-06-03 ENCOUNTER — Telehealth: Admitting: Adult Health

## 2024-06-10 ENCOUNTER — Telehealth: Admitting: Adult Health

## 2024-06-13 ENCOUNTER — Telehealth: Admitting: Adult Health
